# Patient Record
Sex: Female | Born: 1970 | Hispanic: Yes | Marital: Married | Smoking: Never smoker
Health system: Southern US, Community
[De-identification: ages and names within clinical notes are randomized; demographics above are authoritative.]

## PROBLEM LIST (undated history)

## (undated) DIAGNOSIS — K760 Fatty (change of) liver, not elsewhere classified: Secondary | ICD-10-CM

## (undated) DIAGNOSIS — R51 Headache: Secondary | ICD-10-CM

## (undated) DIAGNOSIS — M1990 Unspecified osteoarthritis, unspecified site: Secondary | ICD-10-CM

## (undated) DIAGNOSIS — R109 Unspecified abdominal pain: Secondary | ICD-10-CM

## (undated) DIAGNOSIS — K219 Gastro-esophageal reflux disease without esophagitis: Secondary | ICD-10-CM

## (undated) HISTORY — DX: Fatty (change of) liver, not elsewhere classified: K76.0

## (undated) HISTORY — PX: OTHER SURGICAL HISTORY: SHX169

## (undated) HISTORY — PX: TUBAL LIGATION: SHX77

## (undated) HISTORY — PX: BLADDER SURGERY: SHX569

## (undated) HISTORY — DX: Headache: R51

## (undated) HISTORY — DX: Unspecified osteoarthritis, unspecified site: M19.90

## (undated) HISTORY — DX: Unspecified abdominal pain: R10.9

## (undated) HISTORY — DX: Gastro-esophageal reflux disease without esophagitis: K21.9

---

## 2004-08-23 ENCOUNTER — Emergency Department (HOSPITAL_COMMUNITY): Admission: EM | Admit: 2004-08-23 | Discharge: 2004-08-24 | Payer: Self-pay | Admitting: Emergency Medicine

## 2005-02-14 ENCOUNTER — Emergency Department (HOSPITAL_COMMUNITY): Admission: EM | Admit: 2005-02-14 | Discharge: 2005-02-14 | Payer: Self-pay | Admitting: Emergency Medicine

## 2005-12-10 ENCOUNTER — Encounter: Admission: RE | Admit: 2005-12-10 | Discharge: 2005-12-10 | Payer: Self-pay | Admitting: Internal Medicine

## 2006-08-04 HISTORY — PX: OTHER SURGICAL HISTORY: SHX169

## 2006-08-04 HISTORY — PX: RECTAL PROLAPSE REPAIR: SHX759

## 2006-12-24 ENCOUNTER — Ambulatory Visit: Payer: Self-pay | Admitting: Unknown Physician Specialty

## 2006-12-31 ENCOUNTER — Inpatient Hospital Stay: Payer: Self-pay | Admitting: Unknown Physician Specialty

## 2008-09-05 ENCOUNTER — Encounter: Admission: RE | Admit: 2008-09-05 | Discharge: 2008-09-05 | Payer: Self-pay | Admitting: Neurology

## 2010-08-04 DIAGNOSIS — K760 Fatty (change of) liver, not elsewhere classified: Secondary | ICD-10-CM

## 2010-08-04 HISTORY — DX: Fatty (change of) liver, not elsewhere classified: K76.0

## 2010-10-16 ENCOUNTER — Other Ambulatory Visit: Payer: Self-pay | Admitting: Family Medicine

## 2010-10-16 ENCOUNTER — Ambulatory Visit
Admission: RE | Admit: 2010-10-16 | Discharge: 2010-10-16 | Disposition: A | Discharge status: Home / Self Care | Payer: Self-pay | Source: Ambulatory Visit | Attending: Family Medicine | Admitting: Family Medicine

## 2010-11-28 ENCOUNTER — Ambulatory Visit (INDEPENDENT_AMBULATORY_CARE_PROVIDER_SITE_OTHER): Payer: Commercial Managed Care - PPO | Admitting: Gastroenterology

## 2010-11-28 ENCOUNTER — Encounter: Payer: Self-pay | Admitting: Gastroenterology

## 2010-11-28 VITALS — BP 102/64 | HR 68 | Ht 62.0 in | Wt 205.0 lb

## 2010-11-28 DIAGNOSIS — K760 Fatty (change of) liver, not elsewhere classified: Secondary | ICD-10-CM

## 2010-11-28 DIAGNOSIS — R1011 Right upper quadrant pain: Secondary | ICD-10-CM

## 2010-11-28 DIAGNOSIS — K7689 Other specified diseases of liver: Secondary | ICD-10-CM

## 2010-11-28 NOTE — Patient Instructions (Signed)
Cc: Abbe Amsterdam, MD

## 2010-11-28 NOTE — Progress Notes (Signed)
History of Present Illness: This is a 40 year old female here today with her husband. She notes intermittent right upper quadrant abdominal pain associated with occasional bloating for several months. Her symptoms generally follow meals. She went on a strict weight loss diet about 2 years ago and after returning to her normal diet she noted the onset of the above symptoms. After resuming her weight loss diet. which restricted high fat foods, caffeine and coffee her symptoms have resolved. Recent evaluation at [facility] included normal blood work and abdominal ultrasound was unremarkable only for mild fatty infiltration of the liver. The patient is currently asymptomatic except for rare episodes of heartburn related to spicy foods and the symptoms respond promptly the tongue. She denies abdominal pain chest pain nausea, vomiting, change in bowel habits, constipation, diarrhea, melena, hematochezia.  Review of Systems: Pertinent positive and negative review of systems were noted in the above HPI section. All other review of systems were otherwise negative.  Current Medications, Allergies, Past Medical History, Past Surgical History, Family History and Social History were reviewed in [employer] record.  Physical Exam: General: Well developed , well nourished, no acute distress Head: Normocephalic and atraumatic Eyes:  sclerae anicteric, EOMI Ears: Normal auditory acuity Mouth: No deformity or lesions Neck: Supple, no masses or thyromegaly Lungs: Clear throughout to auscultation Heart: Regular rate and rhythm; no murmurs, rubs or bruits Abdomen: Soft, non tender and non distended. No masses, hepatosplenomegaly or hernias noted. Normal bowel sounds Musculoskeletal: Symmetrical with no gross deformities  Skin: No lesions on visible extremities Pulses:  Normal pulses noted Extremities: No clubbing, cyanosis, edema or deformities noted Neurological: Alert  oriented x 4, grossly nonfocal Cervical Nodes:  No significant cervical adenopathy Inguinal Nodes: No significant inguinal adenopathy Psychological:  Alert and cooperative. Normal mood and affect  Assessment and Recommendations:  1. Right upper quadrant pain that has resolved with dietary changes. Likely dietary intolerances include high fat foods, caffeine and coffee. If her symptoms return despite these dietary restrictions we will consider further evaluation with upper endoscopy and a CCK HIDA scan. Ongoing followup with her primary physician I will see her back if her GI symptoms return.  2. Fatty infiltration of the liver. Recommend a long-term low fat, weight loss diet. Further followup with her primary care physician.

## 2010-12-03 ENCOUNTER — Encounter: Payer: Self-pay | Admitting: Gastroenterology

## 2011-06-03 ENCOUNTER — Other Ambulatory Visit: Payer: Self-pay | Admitting: Unknown Physician Specialty

## 2011-06-03 DIAGNOSIS — Z1231 Encounter for screening mammogram for malignant neoplasm of breast: Secondary | ICD-10-CM

## 2011-06-13 ENCOUNTER — Other Ambulatory Visit: Payer: Self-pay | Admitting: Internal Medicine

## 2011-06-13 ENCOUNTER — Ambulatory Visit
Admission: RE | Admit: 2011-06-13 | Discharge: 2011-06-13 | Disposition: A | Discharge status: Home / Self Care | Payer: Commercial Managed Care - PPO | Source: Ambulatory Visit | Attending: Internal Medicine | Admitting: Internal Medicine

## 2011-06-13 MED ORDER — IOHEXOL 300 MG/ML  SOLN
100.0000 mL | Freq: Once | INTRAMUSCULAR | Status: AC | PRN
  Administered 2011-06-13: 100 mL via INTRAVENOUS

## 2011-07-14 ENCOUNTER — Ambulatory Visit
Admission: RE | Admit: 2011-07-14 | Discharge: 2011-07-14 | Disposition: A | Discharge status: Home / Self Care | Payer: Commercial Managed Care - PPO | Source: Ambulatory Visit | Attending: Unknown Physician Specialty | Admitting: Unknown Physician Specialty

## 2011-07-14 ENCOUNTER — Other Ambulatory Visit: Payer: Self-pay | Admitting: Family Medicine

## 2011-07-14 DIAGNOSIS — Z1231 Encounter for screening mammogram for malignant neoplasm of breast: Secondary | ICD-10-CM

## 2011-07-15 ENCOUNTER — Other Ambulatory Visit: Payer: Self-pay | Admitting: Unknown Physician Specialty

## 2011-07-15 DIAGNOSIS — R928 Other abnormal and inconclusive findings on diagnostic imaging of breast: Secondary | ICD-10-CM

## 2011-07-30 ENCOUNTER — Other Ambulatory Visit: Payer: Commercial Managed Care - PPO

## 2011-08-11 ENCOUNTER — Ambulatory Visit
Admission: RE | Admit: 2011-08-11 | Discharge: 2011-08-11 | Disposition: A | Discharge status: Home / Self Care | Payer: Commercial Managed Care - PPO | Source: Ambulatory Visit | Attending: Unknown Physician Specialty | Admitting: Unknown Physician Specialty

## 2011-08-11 ENCOUNTER — Other Ambulatory Visit: Payer: Self-pay | Admitting: Unknown Physician Specialty

## 2011-08-11 DIAGNOSIS — R928 Other abnormal and inconclusive findings on diagnostic imaging of breast: Secondary | ICD-10-CM

## 2011-08-21 ENCOUNTER — Other Ambulatory Visit: Payer: Self-pay | Admitting: Unknown Physician Specialty

## 2011-08-21 ENCOUNTER — Ambulatory Visit
Admission: RE | Admit: 2011-08-21 | Discharge: 2011-08-21 | Disposition: A | Discharge status: Home / Self Care | Payer: Commercial Managed Care - PPO | Source: Ambulatory Visit | Attending: Unknown Physician Specialty | Admitting: Unknown Physician Specialty

## 2011-08-21 DIAGNOSIS — R928 Other abnormal and inconclusive findings on diagnostic imaging of breast: Secondary | ICD-10-CM

## 2012-03-29 ENCOUNTER — Ambulatory Visit (INDEPENDENT_AMBULATORY_CARE_PROVIDER_SITE_OTHER): Payer: Commercial Managed Care - PPO | Admitting: Physician Assistant

## 2012-03-29 ENCOUNTER — Encounter: Payer: Self-pay | Admitting: Physician Assistant

## 2012-03-29 VITALS — BP 124/76 | HR 86 | Temp 98.2°F | Resp 17 | Ht 62.5 in | Wt 191.0 lb

## 2012-03-29 DIAGNOSIS — R1011 Right upper quadrant pain: Secondary | ICD-10-CM

## 2012-03-29 LAB — POCT CBC
HCT, POC: 41.1 % (ref 37.7–47.9)
MCH, POC: 28.9 pg (ref 27–31.2)
POC Granulocyte: 4.6 (ref 2–6.9)
RBC: 4.4 M/uL (ref 4.04–5.48)

## 2012-03-29 LAB — COMPREHENSIVE METABOLIC PANEL
ALT: 16 U/L (ref 0–35)
Alkaline Phosphatase: 33 U/L — ABNORMAL LOW (ref 39–117)
CO2: 25 mEq/L (ref 19–32)
Chloride: 105 mEq/L (ref 96–112)
Creat: 0.59 mg/dL (ref 0.50–1.10)
Glucose, Bld: 88 mg/dL (ref 70–99)
Sodium: 138 mEq/L (ref 135–145)
Total Bilirubin: 0.4 mg/dL (ref 0.3–1.2)

## 2012-03-29 MED ORDER — RANITIDINE HCL 150 MG PO TABS: 150.0000 mg | ORAL_TABLET | Freq: Two times a day (BID) | ORAL | Status: DC

## 2012-03-29 NOTE — Progress Notes (Signed)
Subjective:    Patient ID: Lauren Andersen, female    DOB: 1971-01-24, 41 y.o.   MRN: [id number]  HPI This 41 y.o. female presents for evaluation of right upper abdominal pain x 3 weeks.  The pain is pressure.  The pain is worst at night, mild during the day. Unable to sleep on that side due to pain.  No apparent change with eating.  No diarrhea or constipation.  No urinary symptoms.  Eating oranges and lemons causes nausea and vomiting, which is new since her pain started.  No fever, chills.  This pain initially began in 2009.  She's had several RUQ ultrasounds and a CT scan.  They revealed a left ovarian cyst and it's resolution, and mild fatty liver infiltration.  She saw Dr. Russella Dar in 11/2010, but doesn't remember it.  His not indicates that her symptoms improved with dietary changes and that if she had a recurrence, he'd plan additional evaluation with an upper endoscopy and CCK HIDA scan.  She was seen here in 06/2011 with the same symptoms, and had trace asymptomatic bacturia.  No culture was sent. She was advised to take Miralax for 3 days.  The patient states her symptoms resolved, but she doesn't recall what helped.  Review of Systems  As above.  No chest pain, SOB, HA, dizziness, vision change, diarrhea, constipation, dysuria, urinary urgency or frequency, myalgias, arthralgias or rash.   Past Medical History  Diagnosis Date  . Hepatic steatosis   . Headache     Past Surgical History  Procedure Date  . Cesarean section     x2  . Bladder surgery   . Tubal ligation     Prior to Admission medications   Medication Sig Start Date End Date Taking? Authorizing Provider  calcium-vitamin D (OSCAL WITH D) 250-125 MG-UNIT per tablet Take 1 tablet by mouth daily.     Yes Historical Provider, MD  fish oil-omega-3 fatty acids 1000 MG capsule Take 1 g by mouth daily.   Yes Historical Provider, MD  Multiple Vitamin (MULTIVITAMIN) tablet Take 1 tablet by mouth daily.     Yes Historical  Provider, MD    No Known Allergies  History   Social History  . Marital Status: Married    Spouse Name: Alecia Lemming    Number of Children: 3  . 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 Smoking status: Never Smoker   . Smokeless tobacco: Never Used  . Alcohol Use: No  . Drug Use: No  . Sexually Active: Yes -- Female partner(s)    Birth Control/ Protection: Surgical   Other Topics Concern  . Not on file   Social History Narrative   Came to the [place] from [place] in 2003.Husband has a son from another partner, plus the three with the patient.    Family History  Problem Relation Age of Onset  . Breast cancer Cousin   . Diabetes Maternal Grandfather   . Thyroid disease Mother   . Mental illness Mother     bipolar disorder, depression       Objective:   Physical Exam  Blood pressure 124/76, pulse 86, temperature 98.2 F (36.8 C), temperature source Oral, resp. rate 17, height 5' 2.5" (1.588 m), weight 191 lb (86.637 kg), last menstrual period 03/15/2012, SpO2 96.00%. Body mass index is 34.38 kg/(m^2). Well-developed, well nourished [place] woman who is awake, alert and oriented, in NAD. HEENT: [place]/AT,  sclera and conjunctiva are clear.   Neck: supple, non-tender, no lymphadenopathy, thyromegaly. Heart: RRR, no murmur Lungs: normal effort, CTA Abdomen: normo-active bowel sounds, supple, no mass or organomegaly.  RUQ tenderness.  Very mild RLQ tenderness.   Extremities: no cyanosis, clubbing or edema. Skin: warm and dry without rash.  Results for orders placed in visit on 03/29/12  POCT CBC      Component Value Range   WBC 7.8  4.6 - 10.2 K/uL   Lymph, poc 2.6  0.6 - 3.4   POC LYMPH PERCENT 33.7  10 - 50 %L   MID (cbc) 0.6  0 - 0.9   POC MID % 7.4  0 - 12 %M   POC Granulocyte 4.6  2 - 6.9   Granulocyte percent 58.9  37 - 80 %G   RBC 4.40  4.04 - 5.48 M/uL   Hemoglobin 12.7  12.2 - 16.2 g/dL   HCT, POC 16.1   09.6 - 47.9 %   MCV 93.5  80 - 97 fL   MCH, POC 28.9  27 - 31.2 pg   MCHC 30.9 (*) 31.8 - 35.4 g/dL   RDW, POC 04.5     Platelet Count, POC 271  142 - 424 K/uL   MPV 8.0  0 - 99.8 fL   She was unable to provide a urine specimen.    Assessment & Plan:   1. Abdominal pain, acute, right upper quadrant  Comprehensive metabolic panel, POCT CBC, Ambulatory referral to Gastroenterology, ranitidine (ZANTAC) 150 MG tablet   Patient Instructions  If you have not heard anything regarding the referral in 10 days, please contact our office. Avoid caffeine, fatty foods and acidic and fatty foods that may increase your symptoms.

## 2012-03-29 NOTE — Patient Instructions (Signed)
If you have not heard anything regarding the referral in 10 days, please contact our office. Avoid caffeine, fatty foods and acidic and fatty foods that may increase your symptoms.

## 2012-03-30 ENCOUNTER — Encounter: Payer: Self-pay | Admitting: Physician Assistant

## 2012-04-16 ENCOUNTER — Ambulatory Visit: Payer: Commercial Managed Care - PPO | Admitting: Gastroenterology

## 2014-06-05 ENCOUNTER — Encounter: Payer: Self-pay | Admitting: Physician Assistant

## 2014-08-22 ENCOUNTER — Ambulatory Visit: Payer: Commercial Managed Care - PPO | Attending: Family Medicine | Admitting: Family Medicine

## 2014-08-22 ENCOUNTER — Encounter: Payer: Self-pay | Admitting: Family Medicine

## 2014-08-22 VITALS — BP 105/66 | HR 60 | Temp 98.5°F | Resp 16 | Ht 62.0 in | Wt 196.0 lb

## 2014-08-22 DIAGNOSIS — H9201 Otalgia, right ear: Secondary | ICD-10-CM

## 2014-08-22 DIAGNOSIS — N644 Mastodynia: Principal | ICD-10-CM

## 2014-08-22 DIAGNOSIS — K760 Fatty (change of) liver, not elsewhere classified: Secondary | ICD-10-CM

## 2014-08-22 DIAGNOSIS — T8589XA Other specified complication of internal prosthetic devices, implants and grafts, not elsewhere classified, initial encounter: Secondary | ICD-10-CM

## 2014-08-22 DIAGNOSIS — T83711S Erosion of implanted vaginal mesh and other prosthetic materials to surrounding organ or tissue, sequela: Secondary | ICD-10-CM

## 2014-08-22 DIAGNOSIS — Z124 Encounter for screening for malignant neoplasm of cervix: Secondary | ICD-10-CM

## 2014-08-22 DIAGNOSIS — T83711A Erosion of implanted vaginal mesh and other prosthetic materials to surrounding organ or tissue, initial encounter: Secondary | ICD-10-CM

## 2014-08-22 DIAGNOSIS — T83718A Erosion of other implanted mesh and other prosthetic materials to surrounding organ or tissue, initial encounter: Secondary | ICD-10-CM

## 2014-08-22 LAB — CBC
HEMATOCRIT: 38.5 % (ref 36.0–46.0)
HEMOGLOBIN: 12.7 g/dL (ref 12.0–15.0)
MCH: 30.2 pg (ref 26.0–34.0)
MCHC: 33 g/dL (ref 30.0–36.0)
MCV: 91.4 fL (ref 78.0–100.0)
MPV: 9.2 fL (ref 8.6–12.4)
PLATELETS: 257 10*3/uL (ref 150–400)
RBC: 4.21 MIL/uL (ref 3.87–5.11)
RDW: 13.4 % (ref 11.5–15.5)
WBC: 7 10*3/uL (ref 4.0–10.5)

## 2014-08-22 LAB — COMPLETE METABOLIC PANEL WITH GFR
ALBUMIN: 4.1 g/dL (ref 3.5–5.2)
ALT: 23 U/L (ref 0–35)
AST: 20 U/L (ref 0–37)
Alkaline Phosphatase: 32 U/L — ABNORMAL LOW (ref 39–117)
BILIRUBIN TOTAL: 0.4 mg/dL (ref 0.2–1.2)
BUN: 20 mg/dL (ref 6–23)
CHLORIDE: 104 meq/L (ref 96–112)
CO2: 24 meq/L (ref 19–32)
CREATININE: 0.69 mg/dL (ref 0.50–1.10)
Calcium: 9.3 mg/dL (ref 8.4–10.5)
GLUCOSE: 82 mg/dL (ref 70–99)
POTASSIUM: 5 meq/L (ref 3.5–5.3)
Sodium: 136 mEq/L (ref 135–145)
Total Protein: 7.4 g/dL (ref 6.0–8.3)

## 2014-08-22 LAB — LIPID PANEL
CHOLESTEROL: 182 mg/dL (ref 0–200)
HDL: 71 mg/dL (ref >39)
LDL CALC: 99 mg/dL (ref 0–99)
Total CHOL/HDL Ratio: 2.6 Ratio
Triglycerides: 59 mg/dL (ref <150)
VLDL: 12 mg/dL (ref 0–40)

## 2014-08-22 NOTE — Assessment & Plan Note (Signed)
1. Painful breast: no dominant mass.  Please call Rolena Infante, [phone],  with the BCCCP (breast and cervical cancer control program) at the [facility] to set up an appointment to verify eligibility for a breast exam, mammogram, ultrasound. If you qualify this will be set up for you.  For pain: I recommend ibuprofen 600 mg up to three times daily with food or tylenol [phone] mg up to three time daily

## 2014-08-22 NOTE — Patient Instructions (Addendum)
Mrs. Lauren Andersen,  Thank you for coming in today. It was a pleasure meeting you. I look forward to being your primary doctor.   1. Painful breast: no dominant mass.  Please call Rolena Infante, [phone],  with the BCCCP (breast and cervical cancer control program) at the [facility] to set up an appointment to verify eligibility for a breast exam, mammogram, ultrasound. If you qualify this will be set up for you.  For pain: I recommend ibuprofen 600 mg up to three times daily with food or tylenol [phone] mg up to three time daily   2. Rectal mesh: On exam I see mesh in your vagina coming up posteriorly ( I believe this is the rectal mesh) Please apply for orange card and [employer] discount. I have placed a referral to gynecology.  You will be called with pap and lab results   F/u in 6 weeks for breast discomfort   Dr. Adrian Blackwater

## 2014-08-22 NOTE — Progress Notes (Signed)
   Subjective:    Patient ID: Lauren Andersen, female    DOB: 1970-11-22, 44 y.o.   MRN: [id number] CC: establish care, R breast pain, Rt ear pain  HPI   1. R breast pain: x 6 months. For the past 2-3 months feeling increased pain and pressure for the past 2-3 months. No skin changes. Patient also feels x mass which has been there for years but is now tender.  She denies symptoms in L breast. She is not taking any OTC for pain.   2. R ear: pain and itching x 4 months. Also when pain on R neck. Sometimes pains is associated with dizziness. No fever, chills, unintentional weight loss.   3. 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 The surgery was done in [place], [place]. Her gynecologist has retired. She does not have op notes. She feels like her bladder mesh and coming out. She reports feeling it when she wipes after urination. She went to another gynecologist in town a few years ago who recommended revision, she wanted to get a 2nd opinion, during that time she loss her insurance.   Soc Hx: chronic non smoker  Med Hx: fatty liver  Surg Hx: c-section x 2  Review of Systems As per HPI  GAD-7: score of 0    Objective:   Physical Exam BP 105/66 mmHg  Pulse 60  Temp(Src) 98.5 F (36.9 C)  Resp 16  Ht 5\' 2"  (1.575 m)  Wt 196 lb (88.905 kg)  BMI 35.84 kg/m2  SpO2 97%  LMP 08/07/2014 General appearance: alert, cooperative and no distress Head: Normocephalic, without obvious abnormality, atraumatic Eyes: conjunctivae/corneas clear. PERRL, EOM's intact. Fundi benign. Ears: normal TM and external ear canal left ear and abnormal TM right ear - air-fluid level Nose: no discharge, turbinates pink, swollen Throat: lips, mucosa, and tongue normal; teeth and gums normal Neck: no adenopathy and thyroid not enlarged, symmetric, no tenderness/mass/nodules Chest: normal WOB Breasts: Inspection negative, No nipple retraction or  dimpling, No nipple discharge or bleeding, No axillary or supraclavicular adenopathy, positive findings: tenderness throughout both breast with no dominant mass  Pelvic: normal external genitalia, mesh material protruding from posterior vaginal wall, otherwise normal vagina, normal cervix, scant mucoid discharge. No CMT, uterine or adnexal tenderness, normal rectal exam w/o pain, mass or tenderness.  Pap done today.     Assessment & Plan:

## 2014-08-22 NOTE — Progress Notes (Signed)
Patient here to establish care C/o right ear pain/dizziness for 4 months Rt. Breast pain-last mammogram in 2013 Pap in 2013 Patient is not fasting Refused flu shot

## 2014-08-22 NOTE — Assessment & Plan Note (Signed)
F/u CMP and lipids today

## 2014-08-22 NOTE — Assessment & Plan Note (Signed)
Pap done today  

## 2014-08-22 NOTE — Assessment & Plan Note (Addendum)
A:  Rectal mesh erosion.  P: On exam I see mesh in your vagina coming up posteriorly ( I believe this is the rectal mesh) Please apply for orange card and [place] discount. I have placed a referral to gynecology.  Addendum: referral to urology at [facility]

## 2014-08-23 LAB — CYTOLOGY - PAP

## 2014-08-23 LAB — CERVICOVAGINAL ANCILLARY ONLY
CHLAMYDIA, DNA PROBE: NEGATIVE
NEISSERIA GONORRHEA: NEGATIVE
WET PREP (BD AFFIRM): NEGATIVE
WET PREP (BD AFFIRM): NEGATIVE
Wet Prep (BD Affirm): NEGATIVE

## 2014-08-27 NOTE — Addendum Note (Signed)
Addended by: Boykin Nearing on: 08/27/2014 07:01 PM   Modules accepted: Orders

## 2014-08-28 ENCOUNTER — Telehealth: Payer: Self-pay | Admitting: *Deleted

## 2014-08-28 NOTE — Telephone Encounter (Signed)
Pt aware of lab wet prep and pap smear results     Notes Recorded by Minerva Ends, MD on 08/24/2014 at 5:57 PM Negative pap, repeat in 3 years

## 2014-08-28 NOTE — Telephone Encounter (Signed)
-----   Message from Minerva Ends, MD sent at 08/24/2014  1:11 PM EST ----- All labs normal Negative wet prep. Still awaiting pap results

## 2014-09-08 ENCOUNTER — Ambulatory Visit: Payer: Commercial Managed Care - PPO

## 2014-09-27 ENCOUNTER — Other Ambulatory Visit (HOSPITAL_COMMUNITY): Payer: Self-pay | Admitting: *Deleted

## 2014-09-27 ENCOUNTER — Encounter (HOSPITAL_COMMUNITY): Payer: Self-pay | Admitting: *Deleted

## 2014-09-27 DIAGNOSIS — N644 Mastodynia: Principal | ICD-10-CM

## 2014-09-28 ENCOUNTER — Encounter (HOSPITAL_COMMUNITY): Payer: Self-pay

## 2014-09-28 ENCOUNTER — Ambulatory Visit (HOSPITAL_COMMUNITY)
Admission: RE | Admit: 2014-09-28 | Discharge: 2014-09-28 | Disposition: A | Discharge status: Home / Self Care | Payer: Commercial Managed Care - PPO | Source: Ambulatory Visit | Attending: Obstetrics and Gynecology | Admitting: Obstetrics and Gynecology

## 2014-09-28 VITALS — BP 118/70 | Temp 97.9°F | Ht 62.0 in | Wt 195.2 lb

## 2014-09-28 DIAGNOSIS — Z1239 Encounter for other screening for malignant neoplasm of breast: Principal | ICD-10-CM

## 2014-09-28 DIAGNOSIS — N644 Mastodynia: Secondary | ICD-10-CM

## 2014-09-28 DIAGNOSIS — N6310 Unspecified lump in the right breast, unspecified quadrant: Secondary | ICD-10-CM

## 2014-09-28 DIAGNOSIS — N6315 Unspecified lump in the right breast, overlapping quadrants: Secondary | ICD-10-CM

## 2014-09-28 NOTE — Addendum Note (Signed)
Encounter addended by: Loletta Parish, RN on: 09/28/2014 10:58 AM<BR>     Documentation filed: Patient Instructions Section

## 2014-09-28 NOTE — Patient Instructions (Addendum)
Education materials on self breast awareness given. Explained to Lauren Andersen that [place] will cover Pap smears and co-testing every 5 years unless has a history of abnormal Pap smears. Let patient know that her next Pap smear will be due in January 2021. Referred patient to the [place] for bilateral diagnostic mammogram possible right breast ultrasoud. Appointment scheduled for Thursday, October 05, 2014 at 0900. Patient aware of appointment and will be there. [name] verbalized understanding.

## 2014-09-28 NOTE — Progress Notes (Addendum)
Complaints of right breast pain x 6 months that feels like something poking. Patient states pain comes and goes lasting for around 3-5 minutes. Patient rates pain at a 9-10 out of 10. Patient complained of a right breast lump that has been there since she was 44 years old.  Pap Smear:  Pap smear not completed today. Last Pap smear was 08/22/2014 at [facility] and normal with negative HPV. Per patient has no history of an abnormal Pap smear. Last Pap smear result is in EPIC.  Physical exam: Breasts Left breast is slightly larger than right breast.  No skin abnormalities bilateral breasts. No nipple retraction bilateral breasts. No nipple discharge bilateral breasts. No lymphadenopathy. No lumps palpated left breast. Palpated a lump within the right breast at 6 o'clock 3 cm from the nipple. Complaints of left outer and lower breast tenderness on exam. Patient complained of right lower breast and pain when palpated lump. Referred patient to the [facility] for bilateral diagnostic mammogram possible right breast ultrasoud. Appointment scheduled for Thursday, October 05, 2014 at 0900.  Pelvic/Bimanual No Pap smear completed today since last Pap smear was 08/22/2014. Pap smear not indicated per BCCCP guidelines.   Used interpreter Benjamine Sprague.

## 2014-09-28 NOTE — Addendum Note (Signed)
Encounter addended by: Loletta Parish, RN on: 09/28/2014 11:41 AM<BR>     Documentation filed: Notes Section

## 2014-10-05 ENCOUNTER — Ambulatory Visit
Admission: RE | Admit: 2014-10-05 | Discharge: 2014-10-05 | Disposition: A | Discharge status: Home / Self Care | Payer: No Typology Code available for payment source | Source: Ambulatory Visit | Attending: Obstetrics and Gynecology | Admitting: Obstetrics and Gynecology

## 2014-10-05 DIAGNOSIS — N644 Mastodynia: Secondary | ICD-10-CM

## 2015-08-15 ENCOUNTER — Ambulatory Visit (INDEPENDENT_AMBULATORY_CARE_PROVIDER_SITE_OTHER): Payer: Self-pay | Admitting: Urgent Care

## 2015-08-15 DIAGNOSIS — M62838 Other muscle spasm: Secondary | ICD-10-CM

## 2015-08-15 DIAGNOSIS — M542 Cervicalgia: Secondary | ICD-10-CM

## 2015-08-15 DIAGNOSIS — M62830 Muscle spasm of back: Secondary | ICD-10-CM

## 2015-08-15 DIAGNOSIS — M549 Dorsalgia, unspecified: Secondary | ICD-10-CM

## 2015-08-15 DIAGNOSIS — M6248 Contracture of muscle, other site: Secondary | ICD-10-CM

## 2015-08-15 MED ORDER — CYCLOBENZAPRINE HCL 5 MG PO TABS: 5.0000 mg | ORAL_TABLET | Freq: Three times a day (TID) | ORAL | Status: DC | PRN

## 2015-08-15 MED ORDER — MELOXICAM 7.5 MG PO TABS: 7.5000 mg | ORAL_TABLET | Freq: Every day | ORAL | Status: DC
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
## 2018-02-16 ENCOUNTER — Ambulatory Visit: Payer: Self-pay | Attending: Nurse Practitioner | Admitting: Nurse Practitioner

## 2018-02-16 ENCOUNTER — Encounter: Payer: Self-pay | Admitting: Nurse Practitioner

## 2018-02-16 ENCOUNTER — Ambulatory Visit: Payer: Self-pay | Admitting: Nurse Practitioner

## 2018-02-16 VITALS — BP 113/77 | HR 90 | Temp 99.0°F | Ht 62.0 in | Wt 216.0 lb

## 2018-02-16 DIAGNOSIS — M7989 Other specified soft tissue disorders: Secondary | ICD-10-CM

## 2018-02-16 DIAGNOSIS — R399 Unspecified symptoms and signs involving the genitourinary system: Principal | ICD-10-CM

## 2018-02-16 DIAGNOSIS — N946 Dysmenorrhea, unspecified: Secondary | ICD-10-CM

## 2018-02-16 DIAGNOSIS — N921 Excessive and frequent menstruation with irregular cycle: Secondary | ICD-10-CM

## 2018-02-16 DIAGNOSIS — M79672 Pain in left foot: Secondary | ICD-10-CM | POA: Insufficient documentation

## 2018-02-16 LAB — POCT URINALYSIS DIPSTICK
Bilirubin, UA: NEGATIVE
Blood, UA: NEGATIVE
Glucose, UA: NEGATIVE
LEUKOCYTES UA: NEGATIVE
Nitrite, UA: NEGATIVE
PH UA: 5 (ref 5.0–8.0)
Protein, UA: NEGATIVE
Spec Grav, UA: 1.025 (ref 1.010–1.025)
UROBILINOGEN UA: 0.2 U/dL

## 2018-02-16 MED ORDER — IBUPROFEN 800 MG PO TABS: 800.0000 mg | ORAL_TABLET | Freq: Three times a day (TID) | ORAL | 1 refills | Status: DC | PRN

## 2018-02-16 NOTE — Patient Instructions (Signed)
Fascitis plantar  (Plantar Fasciitis)  La fascitis plantar es una afeccin dolorosa que se produce en el taln. Ocurre cuando la banda de tejido que conecta los dedos con el hueso del taln (fascia plantar) se irrita. Esto puede ocurrir despus de hacer mucho ejercicio u otras actividades repetitivas (lesin por uso excesivo). El dolor de la fascitis plantar puede ser de leve (irritacin) a intenso, y en los casos ms agudos puede dificultar que la persona camine o se mueva. Por lo general, el dolor es peor a la maana o despus de permanecer sentado o acostado durante un perodo.  CAUSAS  Este trastorno puede ser causado por:   Estar de pie durante largos perodos.   Usar zapatos que no calcen bien.   Practicar actividades de alto impacto, como correr, o hacer ejercicios aerbicos o ballet.   Tener sobrepeso.   Tener una forma de caminar (marcha) anormal.   Tener los msculos de la pantorrilla tensos.   Tener el arco alto en los pies.   Comenzar una nueva actividad fsica.  SNTOMAS  El sntoma principal de esta afeccin es el dolor en el taln. Otros sntomas pueden ser los siguientes:   Dolor que empeora despus de una actividad o un ejercicio.   Dolor ms intenso a la maana o despus de descansar.   Dolor que desaparece despus de caminar durante unos minutos.  DIAGNSTICO  Esta afeccin se puede diagnosticar en funcin de los signos y los sntomas. El mdico tambin le realizar un examen fsico para controlar si tiene lo siguiente:   Un rea dolorida en la parte inferior del pie.   El arco alto.   Dolor al mover el pie.   Dificultad para mover el pie.  Tambin puede necesitar estudios por imgenes para confirmar el diagnstico. Estos pueden incluir los siguientes:   Radiografas.   Ecografa.   Resonancia magntica.  TRATAMIENTO  El tratamiento de la fascitis plantar depende de la gravedad de la afeccin. El tratamiento puede incluir lo siguiente:   Reposo, hielo y analgsicos de venta  libre para controlar el dolor.   Ejercicios para estirar las pantorrillas y la fascia plantar.   Una frula que mantiene el pie estirado y hacia arriba mientras usted duerme (frula nocturna).   Fisioterapia para aliviar los sntomas y evitar problemas en el futuro.   Inyecciones de cortisona para aliviar el dolor intenso.   Tratamiento con ondas de choque extracorpreas para estimular con impulsos elctricos la fascia plantar lesionada. Esto suele usarse como un ltimo recurso antes de la ciruga.   Ciruga, si los otros tratamientos no han funcionado despus de 12meses.  INSTRUCCIONES PARA EL CUIDADO EN EL HOGAR   Tome los medicamentos solamente como se lo haya indicado el mdico.   Evite las actividades que causan dolor.   Frote la parte inferior del pie sobre una bolsa de hielo o una botella de agua fra. Haga esto durante 20minutos, de 3a 4veces al da.   Realice estiramientos simples como se lo haya indicado el mdico.   Trate de usar calzado deportivo con amortiguacin de aire o gel, o plantillas blandas.   Si el mdico se lo ha indicado, use una frula nocturna para dormir.   Cumpla con todas las visitas de control, segn le indique su mdico.  PREVENCIN   No realice ejercicios ni actividades que le causen dolor en el taln.   Considere la posibilidad de empezar actividades de bajo impacto si sigue teniendo problemas.   Pierda peso si   lo necesita.  La mejor forma de prevenir la fascitis plantar es evitar las actividades que lesionan ms la fascia plantar.  SOLICITE ATENCIN MDICA SI:   Los sntomas no desaparecen despus del tratamiento en su casa.   El dolor empeora.   El dolor afecta la capacidad de moverse o de realizar las actividades diarias.  Esta informacin no tiene como fin reemplazar el consejo del mdico. Asegrese de hacerle al mdico cualquier pregunta que tenga.  Document Released: 04/30/2005 Document Revised: 11/12/2015 Document Reviewed: 05/31/2014  Elsevier  Interactive Patient Education  2018 Elsevier Inc.

## 2018-02-16 NOTE — Progress Notes (Signed)
Assessment & Plan:  Lauren Andersen was seen today for establish care.  Diagnoses and all orders for this visit:  UTI symptoms -     Urinalysis Dipstick  Dysmenorrhea -     Urine cytology ancillary only -     ibuprofen (ADVIL,MOTRIN) 800 MG tablet; Take 1 tablet (800 mg total) by mouth every 8 (eight) hours as needed.  Left foot pain -     DG Foot Complete Left; Future Chronic. Endorses Pain in the heel of the foot and well as ankle swelling. She denies any trauma or injury. Pain prevents her from exercising. I do not feel she needs an xray however she is requesting an xray be ordered and is willing to pay out of pocket despite my recommendations to practice exercises at home for plantar fasciitis.   Patient has been counseled on age-appropriate routine health concerns for screening and prevention. These are reviewed and up-to-date. Referrals have been placed accordingly. Immunizations are up-to-date or declined.    Subjective:   Chief Complaint  Patient presents with  . Establish Care    Pt. is here to establish care. Pt. stated she is having urinary problem with her urine are dripping instead of normal flow and comes out slowly.    HPI Lauren Andersen 47 y.o. female presents to office today to establish care. VRI was used to communicate directly with patient for the entire encounter including providing detailed patient instructions. Her husband is accompanying her today.    UTI Symptoms She has complaints of UTI symptoms. However per review of her records she was referred to GYN after her PCP at that time noticed her rectal mesh inside her vagina. She had bladder and rectal prolapse with mesh placed in 2008. She had some complications thereafter and felt as if her mesh had dislodged. Unfortunately her GYN had retired so she had to see another GYN who recommended revision. She did not follow up due to lack of insurance. She will need to be referred however her financial assistance needs to  be renewed. Symptoms today are endorsed as experiencing a slower urinary stream.  She has had symptoms for several months. Patient denies fever, stomach ache and vaginal discharge. Patient does not have a history of recurrent UTI.  Patient does not have a history of pyelonephritis.  She endorses dysmenorrhea,  menorrhagia with metrorrhagia, menstrual blood is darker in color and there is a foul odor.  Onset of these symptoms: 5 months ago.     Review of Systems  Constitutional: Negative for fever, malaise/fatigue and weight loss.  HENT: Negative.  Negative for nosebleeds.   Eyes: Negative.  Negative for blurred vision, double vision and photophobia.  Respiratory: Negative.  Negative for cough and shortness of breath.   Cardiovascular: Negative.  Negative for chest pain, palpitations and leg swelling.  Gastrointestinal: Negative.  Negative for heartburn, nausea and vomiting.  Genitourinary:       SEE HPI  Musculoskeletal: Positive for joint pain. Negative for myalgias.       SEE HPI  Neurological: Negative.  Negative for dizziness, focal weakness, seizures and headaches.  Psychiatric/Behavioral: Negative.  Negative for suicidal ideas.    Past Medical History:  Diagnosis Date  . Fatty liver 2012  . Headache(784.0)   . Hepatic steatosis     Past Surgical History:  Procedure Laterality Date  . bladder mesh  2008  . BLADDER SURGERY    . CESAREAN SECTION     x2  . colon mesh    .  RECTAL PROLAPSE REPAIR  2008  . TUBAL LIGATION      Family History  Problem Relation Age of Onset  . Thyroid disease Mother   . Mental illness Mother        bipolar disorder, depression  . Breast cancer Cousin   . Cancer Maternal Aunt   . Cancer Maternal Aunt     Social History Reviewed with no changes to be made today.   Outpatient Medications Prior to Visit  Medication Sig Dispense Refill  . calcium-vitamin D (OSCAL WITH D) 250-125 MG-UNIT per tablet Take 1 tablet by mouth daily.      . fish  oil-omega-3 fatty acids 1000 MG capsule Take 1 g by mouth daily. Reported on 08/15/2015    . Multiple Vitamin (MULTIVITAMIN) tablet Take 1 tablet by mouth daily.      . ranitidine (ZANTAC) 150 MG tablet Take 1 tablet (150 mg total) by mouth 2 (two) times daily. 60 tablet 0  . cyclobenzaprine (FLEXERIL) 5 MG tablet Take 1-2 tablets (5-10 mg total) by mouth 3 (three) times daily as needed for muscle spasms. (Patient not taking: Reported on 02/16/2018) 60 tablet 1  . meloxicam (MOBIC) 7.5 MG tablet Take 1-2 tablets (7.5-15 mg total) by mouth daily. (Patient not taking: Reported on 02/16/2018) 30 tablet 0   No facility-administered medications prior to visit.     No Known Allergies     Objective:    BP 113/77 (BP Location: Left Arm, Patient Position: Sitting, Cuff Size: Large)   Pulse 90   Temp 99 F (37.2 C) (Oral)   Ht 5\' 2"  (1.575 m)   Wt 216 lb (98 kg)   SpO2 95%   BMI 39.51 kg/m  Wt Readings from Last 3 Encounters:  02/16/18 216 lb (98 kg)  08/15/15 203 lb 12.8 oz (92.4 kg)  09/28/14 195 lb 3.2 oz (88.5 kg)    Physical Exam  Constitutional: She is oriented to person, place, and time. She appears well-developed and well-nourished. She is cooperative.  HENT:  Head: Normocephalic and atraumatic.  Eyes: EOM are normal.  Neck: Normal range of motion.  Cardiovascular: Normal rate, regular rhythm, normal heart sounds and intact distal pulses. Exam reveals no gallop and no friction rub.  No murmur heard. Pulmonary/Chest: Effort normal and breath sounds normal. No tachypnea. No respiratory distress. She has no decreased breath sounds. She has no wheezes. She has no rhonchi. She has no rales. She exhibits no tenderness.  Abdominal: Bowel sounds are normal.  Musculoskeletal: Normal range of motion. She exhibits no edema or deformity.       Left foot: There is normal range of motion and no bony tenderness.       Feet:  Neurological: She is alert and oriented to person, place, and time.  Coordination normal.  Skin: Skin is warm and dry.  Psychiatric: She has a normal mood and affect. Her behavior is normal. Judgment and thought content normal.  Nursing note and vitals reviewed.      Patient has been counseled extensively about nutrition and exercise as well as the importance of adherence with medications and regular follow-up. The patient was given clear instructions to go to ER or return to medical center if symptoms don't improve, worsen or new problems develop. The patient verbalized understanding.   Follow-up: Return for PAP SMEAR, Fasting labs.   Gildardo Pounds, FNP-BC [facility] [place], [place]   02/19/2018, 12:03 AM

## 2018-02-17 ENCOUNTER — Ambulatory Visit (HOSPITAL_COMMUNITY)
Admission: RE | Admit: 2018-02-17 | Discharge: 2018-02-17 | Disposition: A | Discharge status: Home / Self Care | Payer: Self-pay | Source: Ambulatory Visit | Attending: Nurse Practitioner | Admitting: Nurse Practitioner

## 2018-02-17 DIAGNOSIS — M79675 Pain in left toe(s): Secondary | ICD-10-CM

## 2018-02-17 DIAGNOSIS — M79672 Pain in left foot: Principal | ICD-10-CM

## 2018-02-19 ENCOUNTER — Encounter: Payer: Self-pay | Admitting: Nurse Practitioner

## 2018-02-19 LAB — URINE CYTOLOGY ANCILLARY ONLY
Bacterial vaginitis: NEGATIVE
Candida vaginitis: NEGATIVE
Chlamydia: NEGATIVE
NEISSERIA GONORRHEA: NEGATIVE
Trichomonas: NEGATIVE

## 2018-02-20 NOTE — Progress Notes (Signed)
This encounter was created in error - please disregard.

## 2018-02-22 ENCOUNTER — Telehealth: Payer: Self-pay

## 2018-02-22 NOTE — Telephone Encounter (Signed)
CMA spoke to patient to inform on lab results.  Patient understood.

## 2018-02-22 NOTE — Telephone Encounter (Signed)
-----   Message from Gildardo Pounds, NP sent at 02/19/2018 12:12 AM EDT ----- Lauren Andersen shows bone spurs in your heel. Bone spurs can sometimes be treated without the assistance of a foot specialist. Some things you can try at home:  Medication Analgesics: To manage pain. ibuprofen Use cold compress Use orthotic shoe inserts Weight loss to manage obesity Resting stressed muscles by strapping or wrapping your foot with athletic tape  Stretching exercises If these do not work you can always call a podiatrist or foot specialist.

## 2018-02-22 NOTE — Telephone Encounter (Signed)
-----   Message from Gildardo Pounds, NP sent at 02/21/2018  8:44 PM EDT ----- Urine negative for any bacterial infection or yeast

## 2018-02-22 NOTE — Telephone Encounter (Signed)
CMA spoke to patient to inform on Xray results and advising.   Patient verified DOB. Patient understood.  Patient stated she will come by the office to get her Xray results.

## 2018-03-24 ENCOUNTER — Ambulatory Visit: Payer: Self-pay | Attending: Nurse Practitioner | Admitting: Nurse Practitioner

## 2018-03-24 ENCOUNTER — Encounter: Payer: Self-pay | Admitting: Nurse Practitioner

## 2018-03-24 VITALS — BP 101/69 | HR 63 | Temp 98.4°F | Ht 62.0 in | Wt 218.2 lb

## 2018-03-24 DIAGNOSIS — Z01419 Encounter for gynecological examination (general) (routine) without abnormal findings: Principal | ICD-10-CM

## 2018-03-24 DIAGNOSIS — Z79899 Other long term (current) drug therapy: Secondary | ICD-10-CM

## 2018-03-24 DIAGNOSIS — Z791 Long term (current) use of non-steroidal anti-inflammatories (NSAID): Secondary | ICD-10-CM

## 2018-03-24 DIAGNOSIS — Z124 Encounter for screening for malignant neoplasm of cervix: Secondary | ICD-10-CM

## 2018-03-24 NOTE — Patient Instructions (Signed)
Prueba de Papanicolaou (Pap Test) POR QU ME DEBO REALIZAR ESTA PRUEBA? A esta prueba tambin se la denomina "frotis de Pap". Es una prueba de deteccin que se [place] para Hydrographic surveyor signos de cncer de vagina, cuello del tero y tero. La prueba tambin puede identificar la presencia de infeccin o cambios precancerosos. El mdico probablemente le recomiende que se realice esta prueba en forma regular. Esta prueba puede realizarse de la siguiente manera:  Cada 3 aos, a partir de los 21 aos.  Cada 5 aos, en combinacin con las pruebas que se realizan para Product manager presencia del virus del Engineer, technical sales (VPH).  Con mayor o menor frecuencia, en funcin de otras enfermedades que tenga. QU TIPO DE MUESTRA SE TOMA? El mdico utilizar un pequeo hisopo de algodn, una esptula de plstico o un cepillo para Field seismologist una muestra de clulas de la superficie del cuello del tero. El cuello del tero es la apertura del tero, que tambin se conoce como matriz. Tambin pueden recolectarse las secreciones del cuello del tero y la vagina. [place]?  Tenga en cuenta en qu etapa del ciclo menstrual se encuentra. Es posible que Estate agent la prueba si est Forensic psychologist en que debe realizrsela.  Si el da en que debe realizarse la prueba tiene una infeccin vaginal aparente, deber reprogramar la prueba.  Pueden pedirle que evite tomar una ducha o bao el da de la prueba o el da anterior.  Algunos medicamentos pueden [employer] de la prueba, como los digitlicos y Lexicographer. Si toma alguno de [employer], hable con su mdico antes de [employer] prueba. [place]? Los [employer] de la prueba pueden indicar diversas enfermedades. Estas pueden incluir lo siguiente:  Cncer. Si bien los resultados de la prueba de Papanicolaou no pueden utilizarse para Community education officer de cuello del tero,  de vagina o de tero, pueden indicar que existe una posibilidad de presencia de cncer. En [place], ser necesario realizar pruebas adicionales para determinar la presencia de cncer.  Enfermedad de transmisin sexual.  Infecciones por hongos.  Infeccin por parsitos.  Infeccin por herpes.  Una enfermedad que causa o favorece la infertilidad. Es su responsabilidad retirar el resultado del [place]. Consulte en el laboratorio o en el departamento en el que fue realizado el estudio cundo y cmo podr [employer]. Comunquese con el mdico si tiene [employer]. Esta informacin no tiene Marine scientist el consejo del mdico. Asegrese de hacerle al mdico cualquier pregunta que tenga. Document Released: 01/07/2008 Document Revised: 08/11/2014 Document Reviewed: 12/12/2013 Elsevier Interactive Patient Education  2018 [place] cncer de cuello uterino Preventing Cervical Cancer El cncer de cuello uterino es un tipo de cncer que se desarrolla en el cuello uterino. El cuello uterino est en la parte inferior del tero. Conecta el tero con la vagina. El tero es el lugar donde el beb se desarrolla durante el embarazo. El cncer ocurre cuando algunas clulas comienzan a tener anomalas y crecen de forma descontrolada. El cncer de cuello uterino se desarrolla lentamente y podra no causar ningn sntoma al principio. Con el paso del [place], el cncer puede desarrollarse a mayor profundidad en el tejido del cuello uterino y expandirse a otras zonas. Si se halla a tiempo, el cncer de cuello uterino puede tratarse de forma eficaz. Tambin puede tomar medidas para prevenir este tipo de cncer. La mayora de los casos de cncer de  cuello uterino son causados por una ITS (infeccin de transmisin sexual) a raz de un virus llamado virus del Engineer, technical sales (VPH). Una forma de disminuir el riesgo de Horticulturist, commercial de cuello uterino es evitar la  infeccin por el VPH. Gerlene Burdock, debe practicar sexo con proteccin y colocarse la vacuna contra el VPH. Tambin es importante que se realice pruebas de Papanicolaou con regularidad, ya que estas pueden ayudar a identificar cambios en las clulas que podran Dentist. Las posibilidades de Actor esta enfermedad tambin pueden reducirse realizando ciertos cambios en el estilo de vida. Cmo puedo protegerme contra el cncer de cuello uterino? Prevencin contra la infeccin por VPH  Pregntele a su mdico acerca de cmo recibir la Charity fundraiser VPH. Si tiene 26aos o menos, es posible que deba colocarse esta vacuna, que se administra en tres dosis durante 10meses. Esta vacuna la protege contra los tipos de VPH que podran Dentist.  Limite la cantidad de personas con las que [employer]. [place], evite tener sexo con personas que han tenido demasiadas parejas sexuales.  Use preservativos de ltex durante las relaciones sexuales. Realizacin de las pruebas de Papanicolaou  Desde los 21aos, realcese pruebas de Papanicolaou con regularidad. Hable con el mdico sobre la frecuencia con la que debe hacerse estas pruebas. ? [employer] de las mujeres que [place] [id number] deben realizarse una prueba de Papanicolaou cada 3aos. ? Benito Mccreedy de las mujeres que [place] [id number] deben realizarse una prueba de Papanicolaou y Ardelia Mems prueba del VPH cada 5aos. ? Las mujeres que presentan un mayor riesgo de Horticulturist, commercial de cuello uterino, como aquellas con un debilitamiento del sistema inmunitario o aquellas que han estado expuestas al frmaco dietilestilbestrol (DES), posiblemente deban realizarse las pruebas con mayor frecuencia. Realizacin de otros cambios en el estilo de vida  No consuma ningn producto que contenga nicotina o tabaco, como cigarrillos y Psychologist, sport and exercise. Si necesita ayuda para dejar de fumar, consulte al mdico.  Consuma,  como mnimo, 5porciones de frutas y verduras todos [place].  Baje de peso si es necesario. Por qu son importantes estos cambios?  Con estos cambios y pruebas de deteccin, se abordan los factores que, segn los expertos, [place] el riesgo de Insurance risk surveyor cncer de cuello uterino. La mejor forma de disminuir el riesgo es seguir estos pasos.  Realizarse pruebas de Papanicolaou con regularidad ayudar a identificar cambios en las clulas que podran Dentist. Luego, se podrn tomar medidas para evitar el desarrollo del cncer.  Estos cambios tambin ayudarn a Public affairs consultant de cuello uterino a tiempo. Si se halla a tiempo, el cncer de cuello uterino puede tratarse de forma eficaz. Puede ser ms peligroso y difcil de tratar si el cncer se ha desarrollado a mayor profundidad en el cuello uterino o si se ha expandido.  Adems de hacer que sea menos propenso a padecer cncer de cuello uterino, estos cambios [employer] otorgarn otros beneficios a su salud, como los que se describen a continuacin: ? Practicar sexo con proteccin es importante para prevenir las ITS y los embarazos no deseados. ? Evitar el consumo de tabaco puede disminuir el riesgo de padecer otros tipos de cncer y problemas cardacos. ? Seguir una dieta saludable y mantener un peso saludable son buenos para su salud general. Qu puede suceder si no se hacen cambios? En su etapa inicial, el cncer de cuello uterino posiblemente no tenga sntomas. El cncer puede tardar varios aos en desarrollarse y expandirse a mayor  profundidad en el tejido del cuello uterino. Esta situacin podra ocurrir sin que usted lo sepa. Si desarrolla algn sntoma, como dolor en la pelvis o alguna secrecin o sangrado inusuales de la vagina, debe consultar con su mdico de inmediato. Si el cncer de cuello uterino no se halla a tiempo, podra tener que someterse a ciertos tratamientos, como radiacin, quimioterapia o [place]. En algunos casos,  realizarse una ciruga podra implicar que no podr quedar embarazada o completar un embarazo con xito. Dnde encontrar apoyo: Hable con el mdico, el enfermero de la escuela o el departamento de salud de su localidad para obtener orientacin sobre los estudios de deteccin y la vacunacin. Es posible que algunos nios y adolescentes puedan colocarse la vacuna contra el VPH sin cargo por medio del programa Vacunas para Nios (Vaccines for [employer]) del gobierno de [place]. Otros lugares en los que se vacuna:  Clnicas de salud pblica. Pregunte al departamento de salud de su localidad.  Centros de salud federalmente calificados, donde solo debera pagar lo que est a su alcance. Para encontrar alguno cerca de su lugar de residencia, consulte este sitio web: http://lyons.com/.  Clnicas de salud rural. Estas son parte de un programa para pacientes de Medicare y de [place] que viven en zonas rurales.  [place] de Deteccin Temprana del Cncer de [place] y de Cuello Uterino de [place] tambin proporciona exmenes de deteccin y servicios de diagnstico de estos tipos de cncer a mujeres de bajos recursos, sin cobertura mdica o con coberturas inadecuadas. El cncer de cuello uterino puede transmitirse de [place] a hijas. Hable con el mdico o el asesor gentico para obtener ms informacin sobre las pruebas genticas para Science writer. Dnde encontrar ms informacin: Puede obtener ms informacin sobre el cncer de cuello uterino [employer] siguientes sitios:  Colegio Americano de Animal nutritionist y Academic librarian of Gynecology): [email or web address].[email or web address]  Sociedad Museum/gallery conservator ([place]): www.cancer.org/cancer/cervicalcancer/  Centros para el Control y Publishing copy de [place](Centers for Disease Control and Prevention, CDC):www.cdc.gov/cancer/cervical/  Resumen  Converse con su mdico acerca de cmo recibir la  Charity fundraiser VPH.  Asegrese de realizarse pruebas de Papanicolaou con regularidad segn las recomendaciones del mdico.  Si siente algn dolor en la pelvis o tiene alguna secrecin o sangrado inusuales de la vagina, consulte con su mdico de inmediato. Esta informacin no tiene Marine scientist el consejo del mdico. Asegrese de hacerle al mdico cualquier pregunta que tenga. Document Released: 10/29/2016 Document Revised: 10/29/2016 Document Reviewed: 08/05/2015 Elsevier Interactive Patient Education  [employer].

## 2018-03-24 NOTE — Progress Notes (Signed)
Assessment & Plan:  Lauren Andersen was seen today for gynecologic exam.  Diagnoses and all orders for this visit:  Pap smear for cervical cancer screening -     Cytology - PAP  Well woman exam with routine gynecological exam -     Cytology - PAP   Patient has been counseled on age-appropriate routine health concerns for screening and prevention. These are reviewed and up-to-date. Referrals have been placed accordingly. Immunizations are up-to-date or declined.    Subjective:   Chief Complaint  Patient presents with  . Gynecologic Exam   HPI Lauren Andersen 47 y.o. female presents to office today for routine PAP smear. She has no issues or concerns today.   Review of Systems  Constitutional: Negative.  Negative for chills, fever, malaise/fatigue and weight loss.  Respiratory: Negative.  Negative for cough, shortness of breath and wheezing.   Cardiovascular: Negative.  Negative for chest pain, orthopnea and leg swelling.  Gastrointestinal: Negative for abdominal pain.  Genitourinary: Negative.  Negative for flank pain.  Skin: Negative.  Negative for rash.  Psychiatric/Behavioral: Negative for suicidal ideas.    Past Medical History:  Diagnosis Date  . Fatty liver 2012  . Headache(784.0)   . Hepatic steatosis     Past Surgical History:  Procedure Laterality Date  . bladder mesh  2008  . BLADDER SURGERY    . CESAREAN SECTION     x2  . colon mesh    . RECTAL PROLAPSE REPAIR  2008  . TUBAL LIGATION      Family History  Problem Relation Age of Onset  . Thyroid disease Mother   . Mental illness Mother        bipolar disorder, depression  . Breast cancer Cousin   . Cancer Maternal Aunt   . Cancer Maternal Aunt     Social History Reviewed with no changes to be made today.   Outpatient Medications Prior to Visit  Medication Sig Dispense Refill  . calcium-vitamin D (OSCAL WITH D) 250-125 MG-UNIT per tablet Take 1 tablet by mouth daily.      . fish oil-omega-3 fatty  acids 1000 MG capsule Take 1 g by mouth daily. Reported on 08/15/2015    . Multiple Vitamin (MULTIVITAMIN) tablet Take 1 tablet by mouth daily.      Marland Kitchen ibuprofen (ADVIL,MOTRIN) 800 MG tablet Take 1 tablet (800 mg total) by mouth every 8 (eight) hours as needed. (Patient not taking: Reported on 03/24/2018) 60 tablet 1  . ranitidine (ZANTAC) 150 MG tablet Take 1 tablet (150 mg total) by mouth 2 (two) times daily. 60 tablet 0   No facility-administered medications prior to visit.     No Known Allergies     Objective:    BP 101/69 (BP Location: Left Arm, Patient Position: Sitting, Cuff Size: Large)   Pulse 63   Temp 98.4 F (36.9 C) (Oral)   Ht 5\' 2"  (1.575 m)   Wt 218 lb 3.2 oz (99 kg)   SpO2 97%   BMI 39.91 kg/m  Wt Readings from Last 3 Encounters:  03/24/18 218 lb 3.2 oz (99 kg)  02/16/18 216 lb (98 kg)  08/15/15 203 lb 12.8 oz (92.4 kg)    Physical Exam  Constitutional: She is oriented to person, place, and time. She appears well-developed and well-nourished.  HENT:  Head: Normocephalic.  Cardiovascular: Normal rate, regular rhythm and normal heart sounds.  Pulmonary/Chest: Effort normal and breath sounds normal.  Abdominal: Soft. Bowel sounds are normal. Hernia  confirmed negative in the right inguinal area and confirmed negative in the left inguinal area.  Genitourinary: Rectum normal and uterus normal. Rectal exam shows no external hemorrhoid. No labial fusion. There is no rash, tenderness, lesion or injury on the right labia. There is no rash, tenderness, lesion or injury on the left labia. Uterus is not deviated and not enlarged. Cervix exhibits discharge. Cervix exhibits no motion tenderness and no friability. Right adnexum displays no mass, no tenderness and no fullness. Left adnexum displays no mass, no tenderness and no fullness. No erythema, tenderness or bleeding in the vagina. No foreign body in the vagina. No signs of injury around the vagina. Vaginal discharge found.    Lymphadenopathy: No inguinal adenopathy noted on the right or left side.       Right: No inguinal adenopathy present.       Left: No inguinal adenopathy present.  Neurological: She is alert and oriented to person, place, and time.  Skin: Skin is warm and dry.  Psychiatric: She has a normal mood and affect. Her behavior is normal. Judgment and thought content normal.       Patient has been counseled extensively about nutrition and exercise as well as the importance of adherence with medications and regular follow-up. The patient was given clear instructions to go to ER or return to medical center if symptoms don't improve, worsen or new problems develop. The patient verbalized understanding.   Follow-up: Return if symptoms worsen or fail to improve.   Gildardo Pounds, FNP-BC [facility] [place], [place]   03/24/2018, 9:43 AM

## 2018-03-26 LAB — CYTOLOGY - PAP
Bacterial vaginitis: NEGATIVE
CHLAMYDIA, DNA PROBE: NEGATIVE
Candida vaginitis: NEGATIVE
Diagnosis: NEGATIVE
HPV (WINDOPATH): NOT DETECTED
NEISSERIA GONORRHEA: NEGATIVE
TRICH (WINDOWPATH): NEGATIVE

## 2018-03-29 ENCOUNTER — Telehealth: Payer: Self-pay

## 2018-03-29 NOTE — Telephone Encounter (Signed)
-----   Message from Gildardo Pounds, NP sent at 03/26/2018  2:05 PM EDT ----- PAP smear is normal. Also there is no evidence of vaginal bacteria or yeast. Next PAP 2022

## 2018-03-29 NOTE — Telephone Encounter (Signed)
CMA attempt to call patient regarding lab results.  No answer and left a VM for patient.  If patient call back, please inform:  PAP smear is normal. Also there is no evidence of vaginal bacteria or yeast. Next PAP 2022

## 2019-02-18 ENCOUNTER — Other Ambulatory Visit: Payer: Self-pay

## 2019-02-18 ENCOUNTER — Ambulatory Visit (INDEPENDENT_AMBULATORY_CARE_PROVIDER_SITE_OTHER): Payer: Self-pay | Admitting: Family Medicine

## 2019-02-18 ENCOUNTER — Encounter: Payer: Self-pay | Admitting: Family Medicine

## 2019-02-18 VITALS — BP 116/74 | HR 78 | Temp 98.7°F | Ht 62.0 in | Wt 220.0 lb

## 2019-02-18 DIAGNOSIS — R339 Retention of urine, unspecified: Secondary | ICD-10-CM

## 2019-02-18 DIAGNOSIS — N644 Mastodynia: Secondary | ICD-10-CM

## 2019-02-18 NOTE — Progress Notes (Signed)
7/17/20203:41 PM  Cassidee Deats May 31, 1971, 48 y.o., female [id number]  Chief Complaint  Patient presents with  . Urine Output    not able to produce urine as normal in the past 2 month, drinks lots of water. Has pain in the stomach. Only takes multivitimins ans omego. No trouble producing stool, that is daily  . Pain    some pain in the left breast, no discharge    HPI:   Patient is a 48 y.o. female who presents today for problems with urination and right painful breast  H/o bladder mesh, rectal prolapse repair [facility] urology visit 2016 - [facility] - evaluated for bladder mesh erosion, plan was for cystoscopy but she was uninsured Pap 2019 - negative mammo 2016 - normal  Has been doing herbalife, when she started taking multivitamins She noticed that she was having difficulties with intermittent emptying her bladder in the morning with low urine outflow She stopped taking the mutlivitamins but symptoms have continued Denies any abd pain, vaginal discharge, constipation, hematuria, dysuria, dyspareunia, nausea, vomiting  Right breast pain,   Intermittent, sharp stabbing, nipple area Sometimes related to menses, but sometimes mid cycle No skin changes, no nipple discharge, no enlargement  Has gained about 50 lbs in past 2 years  Depression screen [facility] 2/9 02/18/2019 03/24/2018 08/15/2015  Decreased Interest 0 0 0  Down, Depressed, Hopeless 0 0 0  PHQ - 2 Score 0 0 0  Altered sleeping - 1 -  Tired, decreased energy - 1 -  Change in appetite - 0 -  Feeling bad or failure about yourself  - 0 -  Trouble concentrating - 0 -  Moving slowly or fidgety/restless - 0 -  Suicidal thoughts - 0 -  PHQ-9 Score - 2 -    Fall Risk  02/18/2019 08/22/2014  Falls in the past year? 0 No  Number falls in past yr: 0 -  Injury with Fall? 0 -     No Known Allergies  Prior to Admission medications   Medication Sig Start Date End Date Taking? Authorizing Provider  fish oil-omega-3 fatty  acids 1000 MG capsule Take 1 g by mouth daily. Reported on 08/15/2015   Yes [provider]  Multiple Vitamin (MULTIVITAMIN) tablet Take 1 tablet by mouth daily.     Yes [provider]  ranitidine (ZANTAC) 150 MG tablet Take 1 tablet (150 mg total) by mouth 2 (two) times daily. 03/29/12 03/29/13  Harrison Mons, PA    Past Medical History:  Diagnosis Date  . Fatty liver 2012  . Headache(784.0)   . Hepatic steatosis     Past Surgical History:  Procedure Laterality Date  . bladder mesh  2008  . BLADDER SURGERY    . CESAREAN SECTION     x2  . colon mesh    . RECTAL PROLAPSE REPAIR  2008  . TUBAL LIGATION      Social History   Tobacco Use  . Smoking status: Never Smoker  . Smokeless tobacco: Never Used  Substance Use Topics  . Alcohol use: No    Family History  Problem Relation Age of Onset  . Thyroid disease Mother   . Mental illness Mother        bipolar disorder, depression  . Breast cancer Cousin   . Cancer Maternal Aunt   . Cancer Maternal Aunt     ROS Per hpi  OBJECTIVE:  Today's Vitals   02/18/19 1532  BP: 116/74  Pulse: 78  Temp:  98.7 F (37.1 C)  TempSrc: Oral  SpO2: 95%  Weight: 220 lb (99.8 kg)  Height: _0  (1.575 m)   Body mass index is 40.24 kg/m.   Physical Exam Vitals signs and nursing note reviewed. Exam conducted with a chaperone present.  Constitutional:      Appearance: She is well-developed.  HENT:     Head: Normocephalic and atraumatic.     Right Ear: Hearing, tympanic membrane, ear canal and external ear normal.     Left Ear: Hearing, tympanic membrane, ear canal and external ear normal.  Eyes:     Conjunctiva/sclera: Conjunctivae normal.     Pupils: Pupils are equal, round, and reactive to light.  Neck:     Musculoskeletal: Neck supple.     Thyroid: No thyromegaly.  Cardiovascular:     Rate and Rhythm: Normal rate and regular rhythm.     Heart sounds: Normal heart sounds. No murmur. No friction rub. No  gallop.   Pulmonary:     Effort: Pulmonary effort is normal.     Breath sounds: Normal breath sounds. No wheezing, rhonchi or rales.  Chest:     Breasts: Breasts are symmetrical.        Right: Tenderness (lower outer quadrant) present. No inverted nipple, mass, nipple discharge or skin change.        Left: No inverted nipple, mass, nipple discharge, skin change or tenderness.  Abdominal:     General: Bowel sounds are normal. There is no distension.     Palpations: Abdomen is soft.     Tenderness: There is no abdominal tenderness. There is no guarding.  Genitourinary:    Labia:        Right: No rash or lesion.        Left: No rash or lesion.      Urethra: No prolapse or urethral swelling.     Vagina: No vaginal discharge or erythema.     Cervix: No cervical motion tenderness, discharge or friability.     Uterus: Not enlarged, not fixed, not tender and no uterine prolapse.      Adnexa:        Right: No mass or tenderness.         Left: No mass or tenderness.       Comments: About 43m area of mesh? Just past introitus, posterior wall No prolapse appreciated Musculoskeletal: Normal range of motion.  Lymphadenopathy:     Cervical: No cervical adenopathy.     Upper Body:     Right upper body: No supraclavicular, axillary or pectoral adenopathy.     Left upper body: No supraclavicular, axillary or pectoral adenopathy.  Skin:    General: Skin is warm and dry.  Neurological:     Mental Status: She is alert and oriented to person, place, and time.     Deep Tendon Reflexes: Reflexes are normal and symmetric.       ASSESSMENT and PLAN  1. Urinary retention Intermittent. Discussed avoidance of medications with anticholinergic properties. Consider urology referral, patient declining at this time as self pay - CMP14+EGFR - Urinalysis, Routine w reflex microscopic - [place]NAL; Future  2. Pain of right breast Discussed cutting back caffeine.  - Ambulatory Referral to [facility] - for  mammogram  Return for after studies.    IRutherford Guys MD [facility] [place] [id number].  [phone] [phone]

## 2019-02-18 NOTE — Patient Instructions (Signed)
Breast and Cervical Cancer Program for [facility]: 1. [place], Coordinator, [phone] 2. [address] regional, Randel Pigg, [place], [phone]

## 2019-02-19 LAB — URINALYSIS, ROUTINE W REFLEX MICROSCOPIC
Bilirubin, UA: NEGATIVE
Glucose, UA: NEGATIVE
Ketones, UA: NEGATIVE
Leukocytes,UA: NEGATIVE
Nitrite, UA: NEGATIVE
Protein,UA: NEGATIVE
RBC, UA: NEGATIVE
Specific Gravity, UA: 1.024 (ref 1.005–1.030)
Urobilinogen, Ur: 0.2 mg/dL (ref 0.2–1.0)
pH, UA: 5.5 (ref 5.0–7.5)

## 2019-02-19 LAB — CMP14+EGFR
ALT: 18 IU/L (ref 0–32)
AST: 18 IU/L (ref 0–40)
Albumin/Globulin Ratio: 1.6 (ref 1.2–2.2)
Albumin: 4.4 g/dL (ref 3.8–4.8)
Alkaline Phosphatase: 42 IU/L (ref 39–117)
BUN/Creatinine Ratio: 16 (ref 9–23)
BUN: 16 mg/dL (ref 6–24)
Bilirubin Total: 0.3 mg/dL (ref 0.0–1.2)
CO2: 22 mmol/L (ref 20–29)
Calcium: 9.4 mg/dL (ref 8.7–10.2)
Chloride: 103 mmol/L (ref 96–106)
Creatinine, Ser: 1 mg/dL (ref 0.57–1.00)
GFR calc Af Amer: 78 mL/min/{1.73_m2} (ref >59)
GFR calc non Af Amer: 67 mL/min/{1.73_m2} (ref >59)
Globulin, Total: 2.7 g/dL (ref 1.5–4.5)
Glucose: 83 mg/dL (ref 65–99)
Potassium: 4.1 mmol/L (ref 3.5–5.2)
Sodium: 141 mmol/L (ref 134–144)
Total Protein: 7.1 g/dL (ref 6.0–8.5)

## 2019-03-03 ENCOUNTER — Encounter: Payer: Self-pay | Admitting: Radiology

## 2019-03-08 ENCOUNTER — Ambulatory Visit
Admission: RE | Admit: 2019-03-08 | Discharge: 2019-03-08 | Disposition: A | Discharge status: Home / Self Care | Payer: No Typology Code available for payment source | Source: Ambulatory Visit | Attending: Family Medicine | Admitting: Family Medicine

## 2019-03-08 DIAGNOSIS — R339 Retention of urine, unspecified: Secondary | ICD-10-CM

## 2019-04-04 ENCOUNTER — Encounter: Payer: Self-pay | Admitting: Radiology

## 2019-08-09 ENCOUNTER — Other Ambulatory Visit: Payer: Self-pay

## 2019-08-09 NOTE — Progress Notes (Signed)
Phoned patient to prescreen for [facility] using two patient identifiers. To arrive at [facility] at  2:00 on 07/09/2020.

## 2019-08-10 ENCOUNTER — Other Ambulatory Visit: Payer: Self-pay

## 2019-08-10 ENCOUNTER — Ambulatory Visit
Admission: RE | Admit: 2019-08-10 | Discharge: 2019-08-10 | Disposition: A | Discharge status: Home / Self Care | Payer: Self-pay | Source: Ambulatory Visit | Attending: Oncology | Admitting: Oncology

## 2019-08-10 ENCOUNTER — Ambulatory Visit: Payer: Self-pay | Attending: Oncology

## 2019-08-10 DIAGNOSIS — Z0000 Encounter for general adult medical examination without abnormal findings: Secondary | ICD-10-CM

## 2019-08-10 DIAGNOSIS — N63 Unspecified lump in unspecified breast: Secondary | ICD-10-CM

## 2019-08-18 ENCOUNTER — Ambulatory Visit
Admission: RE | Admit: 2019-08-18 | Discharge: 2019-08-18 | Disposition: A | Discharge status: Home / Self Care | Payer: Self-pay | Source: Ambulatory Visit | Attending: Oncology | Admitting: Oncology

## 2019-08-18 DIAGNOSIS — N63 Unspecified lump in unspecified breast: Secondary | ICD-10-CM | POA: Insufficient documentation

## 2019-08-19 NOTE — Progress Notes (Signed)
Radilogist discussed Birads 2 findings wth patient.  Letter mailed from [facility] to notify of normal mammogram results.  Patient to return in one year for annual screening.  Copy to [facility].

## 2019-11-07 ENCOUNTER — Encounter (HOSPITAL_COMMUNITY): Payer: Self-pay

## 2019-11-07 ENCOUNTER — Other Ambulatory Visit: Payer: Self-pay

## 2019-11-07 ENCOUNTER — Ambulatory Visit (HOSPITAL_COMMUNITY)
Admission: EM | Admit: 2019-11-07 | Discharge: 2019-11-07 | Disposition: A | Discharge status: Home / Self Care | Payer: Self-pay | Attending: Internal Medicine | Admitting: Internal Medicine

## 2019-11-07 DIAGNOSIS — R338 Other retention of urine: Secondary | ICD-10-CM | POA: Insufficient documentation

## 2019-11-07 LAB — POCT URINALYSIS DIP (DEVICE)
Bilirubin Urine: NEGATIVE
Glucose, UA: NEGATIVE mg/dL
Hgb urine dipstick: NEGATIVE
Ketones, ur: NEGATIVE mg/dL
Leukocytes,Ua: NEGATIVE
Nitrite: NEGATIVE
Protein, ur: NEGATIVE mg/dL
Specific Gravity, Urine: 1.02 (ref 1.005–1.030)
Urobilinogen, UA: 0.2 mg/dL (ref 0.0–1.0)
pH: 6 (ref 5.0–8.0)

## 2019-11-07 LAB — BASIC METABOLIC PANEL
Anion gap: 9 (ref 5–15)
BUN: 15 mg/dL (ref 6–20)
CO2: 25 mmol/L (ref 22–32)
Calcium: 9 mg/dL (ref 8.9–10.3)
Chloride: 102 mmol/L (ref 98–111)
Creatinine, Ser: 0.58 mg/dL (ref 0.44–1.00)
GFR calc Af Amer: >60 mL/min (ref >60)
GFR calc non Af Amer: >60 mL/min (ref >60)
Glucose, Bld: 126 mg/dL — ABNORMAL HIGH (ref 70–99)
Potassium: 4.1 mmol/L (ref 3.5–5.1)
Sodium: 136 mmol/L (ref 135–145)

## 2019-11-07 MED ORDER — TAMSULOSIN HCL 0.4 MG PO CAPS: 0.4000 mg | ORAL_CAPSULE | Freq: Every day | ORAL | 0 refills | Status: DC

## 2019-11-07 NOTE — ED Provider Notes (Signed)
[place]    CSN: [id number] Arrival date & time: 11/07/19  [id number]      History   Chief Complaint Chief Complaint  Patient presents with  . unable to void    HPI Lauren Andersen is a 49 y.o. female comes to urgent care with complaints of lower abdominal pain, inability to void for the past 3 days.  Patient says that his symptoms started insidiously and is become persistent.  She is not able to void except for a few drops of urine every time she tries to void.  Abdominal pain is crampy in nature.  No known aggravating or relieving factors.  Pain was initially intermittent but has become progressively severe.  Is currently 10 out of 10.  She has some mild flank pain bilaterally.  No nausea or vomiting.  No dysuria.Marland Kitchen   HPI  Past Medical History:  Diagnosis Date  . Fatty liver 2012  . Headache(784.0)   . Hepatic steatosis     Patient Active Problem List   Diagnosis Date Noted  . Pain of both breasts 08/22/2014  . Pap smear for cervical cancer screening 08/22/2014  . 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 Hepatic steatosis 08/22/2014    Past Surgical History:  Procedure Laterality Date  . bladder mesh  2008  . BLADDER SURGERY    . CESAREAN SECTION     x2  . colon mesh    . RECTAL PROLAPSE REPAIR  2008  . TUBAL LIGATION      OB History    Gravida  4   Para  3   Term  2   Preterm  1   AB  1   Living  3     SAB  1   TAB      Ectopic      Multiple      Live Births               Home Medications    Prior to Admission medications   Medication Sig Start Date End Date Taking? Authorizing Provider  fish oil-omega-3 fatty acids 1000 MG capsule Take 1 g by mouth daily. Reported on 08/15/2015    [provider]  Multiple Vitamin (MULTIVITAMIN) tablet Take 1 tablet by mouth daily.      [provider]  ranitidine (ZANTAC) 150 MG tablet Take 1 tablet (150 mg total) by mouth 2 (two) times  daily. 03/29/12 03/29/13  Harrison Mons, PA  tamsulosin (FLOMAX) 0.4 MG CAPS capsule Take 1 capsule (0.4 mg total) by mouth daily. 11/07/19   LampteyMyrene Galas, MD    Family History Family History  Problem Relation Age of Onset  . Thyroid disease Mother   . Mental illness Mother        bipolar disorder, depression  . Breast cancer Cousin   . Cancer Maternal Aunt   . Cancer Maternal Aunt     Social History Social History   Tobacco Use  . Smoking status: Never Smoker  . Smokeless tobacco: Never Used  Substance Use Topics  . Alcohol use: No  . Drug use: No     Allergies   Patient has no known allergies.   Review of Systems Review of Systems  Constitutional: Negative.   Genitourinary: Positive for difficulty urinating, flank pain and urgency. Negative for dysuria, hematuria, menstrual problem, pelvic pain and vaginal discharge.  Musculoskeletal: Negative for arthralgias and joint swelling.  Neurological: Negative.  Physical Exam Triage Vital Signs ED Triage Vitals  Enc Vitals Group     BP 11/07/19 0924 (!) 153/108     Pulse Rate 11/07/19 0924 72     Resp 11/07/19 0924 18     Temp 11/07/19 0924 98.2 F (36.8 C)     Temp src --      SpO2 11/07/19 0924 100 %     Weight 11/07/19 0922 230 lb 9.6 oz (104.6 kg)     Height --      Head Circumference --      Peak Flow --      Pain Score 11/07/19 0922 10     Pain Loc --      Pain Edu? --      Excl. in [place]? --    No data found.  Updated Vital Signs BP (!) 153/108 (BP Location: Right Arm)   Pulse 72   Temp 98.2 F (36.8 C)   Resp 18   Wt 104.6 kg   LMP 08/30/2019   SpO2 100%   BMI 42.18 kg/m   Visual Acuity Right Eye Distance:   Left Eye Distance:   Bilateral Distance:    Right Eye Near:   Left Eye Near:    Bilateral Near:     Physical Exam Vitals and nursing note reviewed. Exam conducted with a chaperone present.  Constitutional:      General: She is in acute distress.  HENT:     Right Ear:  Tympanic membrane normal.     Left Ear: Tympanic membrane normal.  Pulmonary:     Effort: Pulmonary effort is normal.     Breath sounds: Normal breath sounds. No wheezing or rhonchi.  Genitourinary:    General: Normal vulva.  Musculoskeletal:        General: Normal range of motion.  Skin:    General: Skin is warm.     Capillary Refill: Capillary refill takes less than 2 seconds.     Coloration: Skin is not jaundiced.     Findings: No bruising or erythema.  Neurological:     General: No focal deficit present.     Mental Status: She is alert and oriented to person, place, and time.      UC Treatments / Results  Labs (all labs ordered are listed, but only abnormal results are displayed) Labs Reviewed  BASIC METABOLIC PANEL  POCT URINALYSIS DIP (DEVICE)    EKG   Radiology No results found.  Procedures Procedures (including critical care time)  Medications Ordered in UC Medications - No data to display  Initial Impression / Assessment and Plan / UC Course  I have reviewed the triage vital signs and the nursing notes.  Pertinent labs & imaging results that were available during my care of the patient were reviewed by me and considered in my medical decision making (see chart for details).     1.  Acute retention of urine: In- and out- Foley catheterization was done About 1500 cc of nonbloody urine was drained Flomax given Point-of-care urinalysis is negative for UTI or hematuria BM P has been sent Return precautions given If symptoms recur patient will need urogynecology evaluation.   Final Clinical Impressions(s) / UC Diagnoses   Final diagnoses:  Acute urinary retention   Discharge Instructions   None    ED Prescriptions    Medication Sig Dispense Auth. Provider   tamsulosin (FLOMAX) 0.4 MG CAPS capsule Take 1 capsule (0.4 mg total) by mouth daily. 30 capsule  Gabriela Irigoyen, Myrene Galas, MD     PDMP not reviewed this encounter.   Chase Picket,  MD 11/07/19 [phone]

## 2019-11-07 NOTE — ED Triage Notes (Signed)
Pt state she has pain and she has not been  able to void. X 3 days .  Pt states the pain is serve.

## 2020-06-25 ENCOUNTER — Ambulatory Visit (INDEPENDENT_AMBULATORY_CARE_PROVIDER_SITE_OTHER): Payer: Self-pay | Admitting: Family Medicine

## 2020-06-25 ENCOUNTER — Other Ambulatory Visit: Payer: Self-pay

## 2020-06-25 ENCOUNTER — Encounter: Payer: Self-pay | Admitting: Family Medicine

## 2020-06-25 VITALS — BP 112/74 | HR 73 | Temp 98.4°F | Ht 62.0 in | Wt 226.0 lb

## 2020-06-25 DIAGNOSIS — M7989 Other specified soft tissue disorders: Secondary | ICD-10-CM

## 2020-06-25 DIAGNOSIS — Z1329 Encounter for screening for other suspected endocrine disorder: Secondary | ICD-10-CM

## 2020-06-25 DIAGNOSIS — Z13220 Encounter for screening for lipoid disorders: Secondary | ICD-10-CM

## 2020-06-25 DIAGNOSIS — Z1239 Encounter for other screening for malignant neoplasm of breast: Secondary | ICD-10-CM

## 2020-06-25 DIAGNOSIS — Z1211 Encounter for screening for malignant neoplasm of colon: Secondary | ICD-10-CM

## 2020-06-25 DIAGNOSIS — Z1321 Encounter for screening for nutritional disorder: Secondary | ICD-10-CM

## 2020-06-25 DIAGNOSIS — Z0000 Encounter for general adult medical examination without abnormal findings: Secondary | ICD-10-CM

## 2020-06-25 DIAGNOSIS — Z131 Encounter for screening for diabetes mellitus: Secondary | ICD-10-CM

## 2020-06-25 LAB — LIPID PANEL

## 2020-06-25 NOTE — Progress Notes (Addendum)
11/22/202111:21 AM  Lauren Andersen 1971-03-21, 49 y.o., female [id number]  Chief Complaint  Patient presents with  . head and neck pain    sharp pain that went up to temple / subsided this saturday   . left leg swelling    2 months     HPI:   Patient is a 49 y.o. female with past medical history significant for  who presents today for transfer of care.  Interpreter services used for this apointment  Left Leg swelling Left leg knee down swelling Started 2-3 months ago  Hurts from knee down No knee pain all mid calf Started out of nowhere  Denies worsening of symptoms Denies injury, negative homan's sign Denies SOB, Chest pain, recent travel,  swelling both ankles has been going on for awhile  Sharp pain Started back of neck to right side of head She tried ear drops twice but no improvement It lasted for 3 days Never happened before in the past Increased pain due to body mechanics  Screening Mammogram: 08/18/19: needs 1 year Pap: 03/2018 nil and HPV negative Colon Cancer screen: No family history,   Appointment in January planned. Will need surgery for this Mesh is coming out in pieces H/o bladder mesh, rectal prolapse repair [facility] 2016 [facility] - evaluated for bladder mesh erosion, plan was for cystoscopy but she was uninsured Dr. Zigmund Daniel for erosin of vaginal mesh, pelvic pressure, itching and urinary hesitancy, pressure from fibroids.    Recently gained a lot of weight Occasionally Right abdominal pain  Pre-Menopause Had a period in May and again in November   Depression screen [facility] 2/9 06/25/2020 02/18/2019 03/24/2018  Decreased Interest 0 0 0  Down, Depressed, Hopeless 0 0 0  PHQ - 2 Score 0 0 0  Altered sleeping - - 1  Tired, decreased energy - - 1  Change in appetite - - 0  Feeling bad or failure about yourself  - - 0  Trouble concentrating - - 0  Moving slowly or fidgety/restless - - 0  Suicidal thoughts - - 0  PHQ-9  Score - - 2    Fall Risk  06/25/2020 02/18/2019 08/22/2014  Falls in the past year? 0 0 No  Number falls in past yr: 0 0 -  Injury with Fall? 0 0 -  Follow up Falls evaluation completed - -     No Known Allergies  Prior to Admission medications   Medication Sig Start Date End Date Taking? Authorizing Provider  fish oil-omega-3 fatty acids 1000 MG capsule Take 1 g by mouth daily. Reported on 08/15/2015   Yes [provider]  Multiple Vitamin (MULTIVITAMIN) tablet Take 1 tablet by mouth daily.     Yes [provider]  ranitidine (ZANTAC) 150 MG tablet Take 1 tablet (150 mg total) by mouth 2 (two) times daily. Patient not taking: Reported on 06/25/2020 03/29/12 03/29/13  Harrison Mons, PA  tamsulosin (FLOMAX) 0.4 MG CAPS capsule Take 1 capsule (0.4 mg total) by mouth daily. Patient not taking: Reported on 06/25/2020 11/07/19   Chase Picket, MD    Past Medical History:  Diagnosis Date  . Fatty liver 2012  . Headache(784.0)   . Hepatic steatosis     Past Surgical History:  Procedure Laterality Date  . bladder mesh  2008  . BLADDER SURGERY    . CESAREAN SECTION     x2  . colon mesh    . RECTAL PROLAPSE REPAIR  2008  .  TUBAL LIGATION      Social History   Tobacco Use  . Smoking status: Never Smoker  . Smokeless tobacco: Never Used  Substance Use Topics  . Alcohol use: No    Family History  Problem Relation Age of Onset  . Thyroid disease Mother   . Mental illness Mother        bipolar disorder, depression  . Breast cancer Cousin   . Cancer Maternal Aunt   . Cancer Maternal Aunt     Review of Systems  Constitutional: Negative for chills, fever and malaise/fatigue.  Eyes: Negative for blurred vision and double vision.  Respiratory: Negative for cough, shortness of breath and wheezing.   Cardiovascular: Positive for leg swelling. Negative for chest pain and palpitations.  Gastrointestinal: Negative for abdominal pain, blood in stool,  constipation, diarrhea, heartburn, nausea and vomiting.  Genitourinary: Negative for dysuria, frequency and hematuria.  Musculoskeletal: Negative for back pain and joint pain.  Skin: Negative for rash.  Neurological: Negative for dizziness, weakness and headaches.     OBJECTIVE:  Today's Vitals   06/25/20 1012  BP: 112/74  Pulse: 73  Temp: 98.4 F (36.9 C)  SpO2: 97%  Weight: 226 lb (102.5 kg)  Height: $Remove'5\' 2"'pxiyvVF$  (1.575 m)   Body mass index is 41.34 kg/m.   Physical Exam Constitutional:      General: She is not in acute distress.    Appearance: Normal appearance. She is not ill-appearing.  HENT:     Head: Normocephalic.     Right Ear: Tympanic membrane, ear canal and external ear normal.     Left Ear: Tympanic membrane, ear canal and external ear normal.  Cardiovascular:     Rate and Rhythm: Normal rate and regular rhythm.     Pulses: Normal pulses.     Heart sounds: Normal heart sounds. No murmur heard.  No friction rub. No gallop.   Pulmonary:     Effort: Pulmonary effort is normal. No respiratory distress.     Breath sounds: Normal breath sounds. No stridor. No wheezing, rhonchi or rales.  Abdominal:     General: Bowel sounds are normal.     Palpations: Abdomen is soft.     Tenderness: There is no abdominal tenderness.  Musculoskeletal:     Right lower leg: No tenderness. 2+ Edema present.     Left lower leg: Swelling (highly visible small vasculature towards knee with slight errythema) and tenderness present. No bony tenderness. 3+ Edema present.  Skin:    General: Skin is warm and dry.  Neurological:     Mental Status: She is alert and oriented to person, place, and time.  Psychiatric:        Mood and Affect: Mood normal.        Behavior: Behavior normal.     No results found for this or any previous visit (from the past 24 hour(s)).  No results found.   ASSESSMENT and PLAN  Problem List Items Addressed This Visit    None    Visit Diagnoses     Encounter for breast cancer screening using non-mammogram modality    -  Primary   Relevant Orders   MM DIGITAL SCREENING BILATERAL   Screening for lipid disorders       Relevant Orders   Lipid Panel   Colon cancer screening       Relevant Orders   Fecal occult blood, imunochemical   Thyroid disorder screen       Relevant Orders  TSH   Screening for diabetes mellitus       Relevant Orders   Hemoglobin A1c   Encounter for vitamin deficiency screening       Relevant Orders   Vitamin D, 25-hydroxy   Left leg swelling       Relevant Orders   US Venous Img Lower Unilateral Left   CMP14+EGFR   US ARTERIAL LOWER EXTREMITY DUPLEX LEFT (NON-ABI)  Discussed RTC/ED precautions Will follow up with Ultrasound results Denies pain, SOB, Chest pain, signs of infection.   Annual physical exam       Relevant Orders   CBC Will follow up with lab results.     Ordred colon cancer screening test and mammogram. Education provided.  Return in about 1 month (around 07/25/2020) for Left leg swelling and ultrasound follow up.    Huston Foley Deziray Nabi, FNP-BC [facility] [place], [place] [address] Ph.  [phone] Fax [phone]

## 2020-06-25 NOTE — Patient Instructions (Addendum)
Recuento de caloras para bajar de peso Calorie Counting for [employer] Loss Las caloras son unidades de Teacher, early years/pre. Su cuerpo necesita una cierta cantidad de caloras de los alimentos para que le ayuden a Company secretary. Cuando come ms caloras de las que el cuerpo necesita, este acumula las caloras extra [place]. Cuando come [employer] de las que el cuerpo [place], este quema grasa para obtener la energa que requiere. El recuento de caloras es el registro de la cantidad de caloras que come y Pharmacologist. El recuento de caloras puede ser de ayuda si necesita perder peso. Si se asegura de comer menos caloras de las que el cuerpo necesita, debe bajar de [place]. Pregntele al mdico cul es un peso sano para usted. Para que el recuento de caloras funcione, tendr que comer la cantidad de caloras adecuadas para usted en un da, para bajar una cantidad de peso saludable por semana. Un nutricionista puede determinar la cantidad de caloras que necesita por da y sugerirle cmo alcanzar su objetivo calrico.  Una cantidad de peso saludable para bajar por semana suele ser [place] 1 y Ivar Drape (0,5 a 0,9kg). Esto significa con frecuencia que su ingesta diaria de caloras se debera reducir unas 500 a 750caloras.  Ingerir 1200 a 1500caloras por Administrator, Civil Service a la [place] a Administrator, Civil Service.  Ingerir de 1500 a 1800caloras por Administrator, Civil Service a la [employer] de los hombres a Administrator, Civil Service. En qu consiste el plan? Mi objetivo es comer __________ Raenette Rover da. Si como esta cantidad de caloras por da, debo bajar unas __________ Terrall Laity. Qu debo saber acerca del recuento de caloras? A fin de alcanzar su objetivo diario de caloras, tendr que:  Averiguar cuntas caloras hay en cada alimento que le Therapist, occupational. Intente hacerlo antes de comer.  Decida la cantidad que puede comer del alimento.  Anote lo que comi y cuntas caloras tena. Esta tarea se conoce  como llevar un registro de comidas. Para perder peso con xito es importante equilibrar el recuento de caloras con un estilo de vida saludable que incluya actividad fsica de forma regular. Tenga un objetivo de 180minutos de ejercicio moderado (como caminar) o 75 minutos de ejercicio vigoroso (como correr) todas las semanas. Dnde encuentro informacin sobre las caloras?  Es posible Animator cantidad de caloras que contiene un alimento en la etiqueta de informacin nutricional. Si un alimento no tiene una etiqueta de informacin nutricional, intente buscar las caloras en Internet o pida ayuda al nutricionista. Recuerde que las caloras se calculan por porcin. Si opta por comer ms de una porcin de un alimento, tendr [employer] las caloras por porcin por la cantidad de porciones que planea comer. Por ejemplo, la etiqueta de un envase de pan puede decir que el tamao de una porcin es [place], y que una porcin tiene 90caloras. Si come 1rodaja, habr comido 90caloras. Si come 2rodajas, habr comido 180caloras. Cmo llevo un registro de comidas? Despus de cada comida, registre la siguiente informacin en el registro de comidas:  Lo que comi. No olvide incluir los aderezos, las salsas y otros extras de la comida.  La cantidad que comi. Esto se puede medir en tazas, onzas o cantidad de alimentos.  Cuntas caloras ingiri por comida y por bebida.  La cantidad total de caloras en la comida. Tenga a Materials engineer de comidas, por ejemplo, en un anotador de bolsillo o utilice una aplicacin mvil o sitio web. Algunos  programas calcularn las caloras y [employer] la cantidad de caloras que le quedan para llegar al objetivo diario. Cules son algunos consejos para el recuento de caloras?   Use las caloras de los alimentos y las bebidas que lo sacien y no lo dejen con apetito: ? Algunos ejemplos de alimentos que lo sacian son los frutos secos y Engineer, mining de  frutos secos, verduras, Advertising account planner y Clinical research associate con alto contenido de Pharmacist, hospital como los cereales integrales. Los alimentos con alto contenido de [place] son aquellos que tienen ms de 5g de fibra por porcin. ? Las [employer] refrescos, especialmente las bebidas a base de caf y los jugos, que contienen muchas caloras, pero no le dan saciedad.  Coma alimentos nutritivos y evite las caloras vacas. Las caloras vacas son aquellas que se obtienen de los alimentos o las bebidas que no contienen muchos nutrientes ni protenas, como los dulces y los refrescos. Es mejor comer una comida nutritiva altamente calrica (como un aguacate) que una con pocos nutrientes (como una bolsa de patatas fritas).  Sepa cuntas caloras tienen los alimentos que come con ms frecuencia. Esto le ayudar a contar las caloras ms rpidamente.  Preste atencin a las [employer]. Las bebidas de bajas caloras incluyen agua y refrescos sin Location manager.  Preste atencin a las etiquetas nutricionales de alimentos "bajos en grasas" o "sin grasas". Estos alimentos a veces tienen la misma cantidad de caloras o ms caloras que las versiones ricas en grasa. Con frecuencia, tambin tienen agregados de azcar, almidn o sal, para darles el sabor que fue eliminado con la grasa.  Encuentre un mtodo para controlar las caloras que funcione para usted. Sea creativo. Pruebe aplicaciones o programas distintos, si llevar un registro de las caloras no funciona para usted. Cules son algunos consejos para controlar las porciones?  Sepa cuntas caloras hay en una porcin. Esto lo ayudar a saber cuntas porciones de un alimento determinado puede comer.  Use una taza medidora para medir los tamaos de las porciones. Tambin Secondary school teacher las porciones en una balanza de cocina. Con el tiempo, podr hacer un clculo estimativo de los tamaos de las porciones de algunos alimentos.  Dedique tiempo a poner porciones de  diferentes alimentos en sus platos, tazones y tazas predilectos, a fin de saber cmo se ve una porcin.  Intente no comer directamente de una bolsa o una caja. Esto puede llevarlo a comer en exceso. Ponga la cantidad [employer] gustara comer en una taza o un plato, a fin de asegurarse de que est comiendo la porcin correcta.  Use platos, vasos y tazones ms pequeos para no comer en exceso.  Intente no realizar varias tareas al [employer] (como mirar la TV o usar su computadora) Addy come. Si es la hora de comer, sintese a Conservation officer, nature y disfrute de Environmental education officer. Esto lo ayudar a Marine scientist cundo est satisfecho. Tambin le ayudar a tomar conciencia de lo que est comiendo y de la cantidad. Cules son algunos consejos para seguir este plan? Lectura de las etiquetas de los alimentos  Controle el recuento de caloras en comparacin con el tamao de la porcin. El tamao de la porcin puede ser ms pequeo de lo que suele comer.  Verifique la fuente de las caloras. Asegrese de que la comida que ingiere tenga alto contenido de vitaminas y protenas y sea baja en grasas saturadas y grasas trans. De compras  Lea las etiquetas nutricionales cuando compre. Esto le ayudar a tomar  decisiones ms saludables antes de comprar una comida.  Haga una lista para el almacn y resptela. La coccin  Intente cocinar sus alimentos preferidos de una manera ms saludable. Por ejemplo, pruebe hornear en vez de frer.  Utilice productos lcteos descremados. Planificacin de los alimentos  Utilice ms frutas y verduras. La mitad de sus platos debe ser de frutas y verduras.  Incluya protenas [employer] y el pescado. Cmo puedo hacer el recuento de caloras cuando como afuera?  Pida porciones ms pequeas.  Considere la posibilidad de Publishing rights manager un plato principal y las guarniciones, en lugar de pedir su propio plato principal.  Si pide su propio plato principal, coma solo la mitad. Pida una caja  al comienzo de la comida y ponga all el resto del plato principal, para no sentir la tentacin de comerlo.  Si se detallan las caloras en el men, elija las opciones que contengan la menor cantidad.  Elija platos que incluyan verduras, frutas, cereales integrales, productos lcteos con bajo contenido de grasa y Advertising account planner.  Opte por los alimentos hervidos, asados, cocidos a la parrilla o al vapor. No coma alimentos que contengan mantequilla, estn empanados, fritos o que se sirvan con salsa a base de crema. Generalmente, los alimentos que se etiquetan como "crujientes" estn fritos, a menos que se indique lo contrario.  Elija el agua, la [place], [place] t helado sin azcar u otras bebidas que no contengan azcares agregados. Si desea una bebida alcohlica, escoja una opcin con menos caloras como una copa de vino o una cerveza ligera.  Ordene los [employer], las salsas y los jarabes aparte. Estos son, con frecuencia, de alto contenido en caloras, por lo que debe limitar la cantidad que ingiere.  Si desea [place], elija una de hortalizas y pida carnes a la parrilla. Evite las guarniciones adicionales como el tocino, el queso o los alimentos fritos. Ordene el aderezo aparte o pida aceite de [place] y vinagre o limn para Haematologist.  Haga un clculo estimativo de la cantidad de porciones que le sirven. Por ejemplo, una porcin de arroz cocido equivale a media taza o la mitad del tamao de una pelota de bisbol. Conocer el tamao de las porciones lo ayudar a Personnel officer atento a la cantidad de comida que come [employer]. La lista que sigue le [place] el tamao de algunas porciones comunes a partir de objetos cotidianos: ? 1onza (28g) = 4dados apilados. ? 3onzas (85g) = 23mazo de cartas. ? 1cucharadita = 1dado. ? 1cucharada = media pelota de tenis de mesa. ? 2cucharadas = 1pelota de tenis de mesa. ? Media taza = media pelota de bisbol. ? 1taza = 1 pelota de  bisbol. Resumen  El recuento de caloras es el registro de la cantidad de caloras que come y Pharmacologist. Si come menos caloras de las que el cuerpo necesita, debe bajar de [place].  Una cantidad de peso saludable para bajar por semana suele ser [place] 1 y Ivar Drape (0,5 a 0,9kg). Esto significa, con frecuencia, reducir su ingesta diaria de caloras unas 500 a 750 caloras.  Es posible Animator cantidad de caloras que contiene un alimento en la etiqueta de informacin nutricional. Si un alimento no tiene una etiqueta de informacin nutricional, intente buscar las caloras en Internet o pida ayuda al nutricionista.  Use las caloras de los alimentos y las bebidas que lo sacien y no de los alimentos y las bebidas que lo dejan con apetito.  Use platos, vasos  y tazones ms pequeos para no comer en exceso. Esta informacin no tiene Marine scientist el consejo del mdico. Asegrese de hacerle al mdico cualquier pregunta que tenga. Document Revised: 10/20/2016 Document Reviewed: 10/20/2016 Elsevier Patient Education  [place] deteccin de Surveyor, minerals Colorectal Cancer Screening  Las pruebas de deteccin de cncer colorrectal se usan para confirmar o descartar este tipo de cncer antes de que se desarrollen los sntomas. Colorrectal se refiere al colon y al recto. El colon y el recto se encuentran al final del tubo digestivo y all se eliminan las deposiciones hacia fuera del cuerpo. Quin debe realizarse la prueba de deteccin? [employer] adultos a Proofreader de los 10 aos [facility] 75 aos deben hacerse pruebas de Programme researcher, broadcasting/film/video. El mdico puede recomendarle las pruebas de deteccin a partir de los 70 aos. Le realizarn pruebas cada 1 a 10 aos, segn los Winn y el tipo de prueba de Programme researcher, broadcasting/film/video. Puede realizarse pruebas de deteccin a partir de una edad ms temprana o con ms frecuencia que Standard Pacific, si usted tiene alguno de los siguientes factores de  riesgo:  Antecedentes personales o familiares de cncer colorrectal o bultos anormales (plipos).  Una enfermedad inflamatoria del intestino, como colitis ulcerosa o enfermedad de Crohn.  Antecedentes de radioterapia en el abdomen o rea plvica para tratamiento de cncer.  Sntomas de Surveyor, minerals, como cambios en los hbitos intestinales o sangre en las heces.  Un tipo de sndrome de cncer de colon que se transmite de padres a hijos (hereditario), como: ? Sndrome de Field seismologist. ? Poliposis adenomatosa familiar. ? Sndrome de [place]. ? Sndrome de Peutz-Jeghers. Las recomendaciones de pruebas de Programme researcher, broadcasting/film/video para los adultos que tienen entre 64 y 29 aos de edad [place] segn la salud. Cmo se realiza este estudio? Hay diversos tipos de pruebas de Manufacturing systems engineer. Pueden hacerle una o ms de las siguientes:  Prueba de sangre oculta en la materia fecal con guayacol. Para realizar esta prueba, se examina una muestra de heces (materia fecal) a fin de Consulting civil engineer oculta (escondida), lo que podra ser un signo de Surveyor, minerals.  Prueba inmunoqumica fecal (PIF). Para realizar esta prueba, se examina Truddie Coco de heces a fin de detectar la presencia de [place], lo que podra ser un signo de Surveyor, minerals.  Prueba de cido desoxirribonucleico (ADN) en heces. Para realizar esta prueba, se examina Truddie Coco de heces a fin de Product manager presencia de sangre y cambios en el ADN que podran Cabin crew.  Sigmoidoscopa. Durante esta prueba, se utiliza un tubo flexible y delgado con una cmara en el extremo (sigmoidoscopio) para examinar el recto y la parte inferior del colon.  Colonoscopa. Durante esta prueba, se utiliza un tubo largo y flexible con una cmara en el extremo (colonoscopio) para examinar todo el colon y el recto. Mediante una colonoscopa es posible tomar una muestra de tejido (biopsia) y extirpar pequeos plipos durante la  prueba.  Colonoscopa virtual. En lugar de un colonoscopio, en este tipo de colonoscopa se utilizan radiografas (exploracin por tomografa computarizada (TC)) y computadoras para generar imgenes del colon y el recto. Cules son los beneficios de las pruebas de deteccin? Las pruebas de deteccin reducen el riesgo de cncer colorrectal y pueden ayudar a identificar el cncer en una etapa temprana, cuando se puede extirpar o tratar con mayor facilidad. Es comn que se formen plipos en el revestimiento del colon, especialmente a medida que envejece. Estos plipos pueden  ser cancerosos o volverse cancerosos con el tiempo. Las pruebas de deteccin pueden identificar estos plipos. Cules son los riesgos de la prueba de deteccin? Cada prueba de deteccin puede [employer].  Las pruebas de muestras de heces tienen menos riesgos que otros tipos de pruebas de Programme researcher, broadcasting/film/video. Sin embargo, es posible que deba realizarse ms pruebas para [employer] Edison de [place] prueba de [place] de [place].  Las pruebas de deteccin con radiografas lo exponen a niveles bajos de radiacin, lo que puede aumentar ligeramente el riesgo de Hotel manager. El beneficio de Futures trader cncer supera el ligero aumento del [place].  Las pruebas de Youth worker la sigmoidoscopa y Engineer, materials colonoscopa pueden conllevar riesgo de sangrado, dao intestinal, infeccin o una reaccin a los medicamentos administrados durante el examen. Consulte a su mdico para comprender su riesgo de Therapist, music y para elaborar un plan de deteccin que sea adecuado para usted. Preguntas para hacerle al mdico  Cundo debo comenzar con las pruebas de deteccin del cncer colorrectal?  Cul es mi riesgo de Best boy cncer colorrectal?  Con qu frecuencia tengo que realizarme pruebas de deteccin?  Qu pruebas de deteccin tengo que realizarme?  Cmo obtengo los resultados de la prueba?  [place] significan los resultados? Dnde  buscar ms informacin Obtenga ms informacin sobre las pruebas de deteccin del Surveyor, minerals en los siguientes sitios:  Sociedad Museum/gallery conservator ([place]): www.cancer.[place] ([place]): www.cancer.gov Resumen  Las pruebas de deteccin de Surveyor, minerals se usan para Firefighter o Teaching laboratory technician tipo de cncer antes de que se desarrollen los sntomas.  Las pruebas de deteccin reducen el riesgo de cncer colorrectal y pueden ayudar a identificar el cncer en una etapa temprana, cuando se puede extirpar o tratar con mayor facilidad.  [employer] of Omaha adultos a Proofreader de los 50 aos [facility] 75 aos deben hacerse pruebas de Programme researcher, broadcasting/film/video. El mdico puede recomendarle las pruebas de deteccin a partir de los 59 aos.  Puede realizarse pruebas de deteccin a partir de una edad ms temprana o con ms frecuencia que Standard [employer], si usted tiene determinados factores de Sales executive.  Consulte a su mdico para comprender su riesgo de Therapist, music y para elaborar un plan de deteccin que sea adecuado para usted. Esta informacin no tiene Marine scientist el consejo del mdico. Asegrese de hacerle al mdico cualquier pregunta que tenga. Document Revised: 08/26/2017 Document Reviewed: 06/19/2017 Elsevier Patient Education  [employer] [place] [employer].   If you have lab work done today you will be contacted with your lab results within the next 2 weeks.  If you have not heard from [place] then please contact us. The fastest way to get your results is to register for My Chart.   IF you received an x-ray today, you will receive an invoice from [facility]. Please contact [facility] at [phone] with questions or concerns regarding your invoice.   IF you received labwork today, you will receive an invoice from [address]. Please contact [facility] at [phone] with questions or concerns regarding your invoice.    Our billing staff will not be able to assist you with questions regarding bills from these companies.  You will be contacted with the lab results as soon as they are available. The fastest way to get your results is to activate your My Chart account. Instructions are located on the last page of this paperwork. If you have not heard from [place] regarding the results  in 2 weeks, please contact this office.

## 2020-06-26 LAB — LIPID PANEL
Chol/HDL Ratio: 3 ratio (ref 0.0–4.4)
Cholesterol, Total: 190 mg/dL (ref 100–199)
HDL: 64 mg/dL (ref >39)
LDL Chol Calc (NIH): 107 mg/dL — ABNORMAL HIGH (ref 0–99)
Triglycerides: 104 mg/dL (ref 0–149)
VLDL Cholesterol Cal: 19 mg/dL (ref 5–40)

## 2020-06-26 LAB — CMP14+EGFR
ALT: 18 IU/L (ref 0–32)
AST: 14 IU/L (ref 0–40)
Albumin/Globulin Ratio: 1.7 (ref 1.2–2.2)
Albumin: 4.5 g/dL (ref 3.8–4.8)
Alkaline Phosphatase: 46 IU/L (ref 44–121)
BUN/Creatinine Ratio: 22 (ref 9–23)
BUN: 13 mg/dL (ref 6–24)
Bilirubin Total: 0.3 mg/dL (ref 0.0–1.2)
CO2: 21 mmol/L (ref 20–29)
Calcium: 9.3 mg/dL (ref 8.7–10.2)
Chloride: 104 mmol/L (ref 96–106)
Creatinine, Ser: 0.6 mg/dL (ref 0.57–1.00)
GFR calc Af Amer: 124 mL/min/{1.73_m2} (ref >59)
GFR calc non Af Amer: 107 mL/min/{1.73_m2} (ref >59)
Globulin, Total: 2.7 g/dL (ref 1.5–4.5)
Glucose: 101 mg/dL — ABNORMAL HIGH (ref 65–99)
Potassium: 4.4 mmol/L (ref 3.5–5.2)
Sodium: 140 mmol/L (ref 134–144)
Total Protein: 7.2 g/dL (ref 6.0–8.5)

## 2020-06-26 LAB — CBC
Hematocrit: 40.2 % (ref 34.0–46.6)
Hemoglobin: 13.3 g/dL (ref 11.1–15.9)
MCH: 29.3 pg (ref 26.6–33.0)
MCHC: 33.1 g/dL (ref 31.5–35.7)
MCV: 89 fL (ref 79–97)
Platelets: 259 10*3/uL (ref 150–450)
RBC: 4.54 x10E6/uL (ref 3.77–5.28)
RDW: 12.6 % (ref 11.7–15.4)
WBC: 6 10*3/uL (ref 3.4–10.8)

## 2020-06-26 LAB — HEMOGLOBIN A1C
Est. average glucose Bld gHb Est-mCnc: 120 mg/dL
Hgb A1c MFr Bld: 5.8 % — ABNORMAL HIGH (ref 4.8–5.6)

## 2020-06-26 LAB — TSH: TSH: 1.06 u[IU]/mL (ref 0.450–4.500)

## 2020-06-26 LAB — VITAMIN D 25 HYDROXY (VIT D DEFICIENCY, FRACTURES): Vit D, 25-Hydroxy: 25.8 ng/mL — ABNORMAL LOW (ref 30.0–100.0)

## 2020-06-26 NOTE — Progress Notes (Signed)
If you could let Lauren Andersen know her A1c and cholesterol are elevated. This places her in the pre-diabetes range. No medications are needed at this time, but continue to work on increasing exercise and improving diet. Her vitamin D is low. If she could take 1000 IU of over the counter Vitamin D3 with food daily this should improve.

## 2020-06-27 ENCOUNTER — Telehealth: Payer: Self-pay

## 2020-06-27 NOTE — Telephone Encounter (Signed)
Pt has come to office and dropped her sample off and I have printed the rec. Pt stated understanding and left.

## 2020-06-28 LAB — FECAL OCCULT BLOOD, IMMUNOCHEMICAL: Fecal Occult Bld: NEGATIVE

## 2020-07-05 ENCOUNTER — Telehealth: Payer: Self-pay | Admitting: Family Medicine

## 2020-07-05 NOTE — Telephone Encounter (Signed)
Pt called and stated she discussed having an ultra sound on her leg. No ultra sound has been put in. Please advise.

## 2020-07-06 NOTE — Telephone Encounter (Signed)
Called and spoke to pt her condition is worsening needs [place] scheduled ASAP thank you

## 2020-07-10 ENCOUNTER — Other Ambulatory Visit: Payer: Self-pay | Admitting: Family Medicine

## 2020-07-10 DIAGNOSIS — M7989 Other specified soft tissue disorders: Secondary | ICD-10-CM

## 2020-07-13 NOTE — Telephone Encounter (Signed)
Pt is calling back again regarding the  status  if referral .   Please advise pt of status . Patient is tried of waiting   Patient is worried  thinking maybe they didn't call  Because she has no insurance and states she can pay . Not to hold this against her

## 2020-07-16 NOTE — Telephone Encounter (Signed)
I have called the Vascular office again to try to get pt an appointment to get her ultra soul. There was no answer so I left a message to call back once more.  I will try once more and then I will let the pt know the phone number to see if she has better luck trying to get her ultrasound scheduled.

## 2020-07-16 NOTE — Telephone Encounter (Addendum)
I have called the patient to inform her that I will see what to do about the referral or the ultra sound. I have called Vascular Specialist office to see if we can get the pt scheduled. Spoke to [place] and she was going to transfer me to [place] that takes care of it. She was not available and will call me back.   I have informed pt that I will call her back with update today.   I will try to call Vascular specialist back @ [phone]

## 2020-07-16 NOTE — Telephone Encounter (Signed)
Spoke to [place] with vascular to get pt scheduled with them and we spoke to her with the Just , NP on the line to get the clarification that is needed to get pt scheduled.    I have called pt back and informed her that I have spoke with [facility] with Vascular specialist who will call her today to get her scheduled for an ultra sound appointment. I apologized that Gershon Mussel cone work que may be back up and gave her the number to Vascular specialist if she doesn't have a call back near the end of day today.   Pt stated understanding.

## 2020-07-17 ENCOUNTER — Other Ambulatory Visit: Payer: Self-pay

## 2020-07-17 DIAGNOSIS — I824Y9 Acute embolism and thrombosis of unspecified deep veins of unspecified proximal lower extremity: Secondary | ICD-10-CM

## 2020-07-18 ENCOUNTER — Ambulatory Visit (HOSPITAL_COMMUNITY)
Admission: RE | Admit: 2020-07-18 | Discharge: 2020-07-18 | Disposition: A | Discharge status: Home / Self Care | Payer: Self-pay | Source: Ambulatory Visit | Attending: Physician Assistant | Admitting: Physician Assistant

## 2020-07-18 ENCOUNTER — Ambulatory Visit (INDEPENDENT_AMBULATORY_CARE_PROVIDER_SITE_OTHER): Payer: Self-pay | Admitting: Physician Assistant

## 2020-07-18 ENCOUNTER — Other Ambulatory Visit: Payer: Self-pay

## 2020-07-18 VITALS — BP 102/70 | HR 61 | Temp 98.4°F | Resp 20 | Ht 62.0 in | Wt 229.0 lb

## 2020-07-18 DIAGNOSIS — I824Y9 Acute embolism and thrombosis of unspecified deep veins of unspecified proximal lower extremity: Secondary | ICD-10-CM

## 2020-07-18 DIAGNOSIS — R600 Localized edema: Secondary | ICD-10-CM

## 2020-07-18 DIAGNOSIS — M79605 Pain in left leg: Secondary | ICD-10-CM

## 2020-07-18 NOTE — Progress Notes (Signed)
Office Note     CC:  follow up Requesting Provider:  Just, Laurita Quint, FNP  HPI: Lauren Andersen is a 49 y.o. (10-20-1970) female who presents bilateral lower extremity edema for 2 to 3 months.  The patient was initially evaluated by family nurse practitioner on June 25, 2020.  Per those notes, the patient complained of left leg swelling for 2 months and on physical exam was found to have bilateral lower extremity edema.  The plan was to proceed with left lower extremity venous duplex ultrasound to rule out DVT, however this was not performed.  The patient was then referred to our office for work-up. She experiences most of the swelling in her foot and ankle which has been going on for approximately 2 years. More recently, she has had left calf swelling. She endorses some mild lower extremity pain particularly after standing for long hours. She does not describe claudication or rest pain. 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 She does elevate her legs regularly, however she has not had success in finding properly fitting compression stockings.  Non-smoker. Negative history of DVT, DM or hypertension   Past Medical History:  Diagnosis Date  . Fatty liver 2012  . Headache(784.0)   . Hepatic steatosis     Past Surgical History:  Procedure Laterality Date  . bladder mesh  2008  . BLADDER SURGERY    . CESAREAN SECTION     x2  . colon mesh    . RECTAL PROLAPSE REPAIR  2008  . TUBAL LIGATION      Social History   Socioeconomic History  . Marital status: Married    Spouse name: Christy Sartorius  . Number of children: 3  . Years of education: Not on file  . Highest education level: Not on file  Occupational History  . 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 Smoking status: Never Smoker  . Smokeless tobacco: Never Used  Vaping Use  . Vaping Use: Never used  Substance and Sexual Activity  . Alcohol use: No  . Drug  use: No  . Sexual activity: Yes    Partners: Male    Birth control/protection: Surgical  Other Topics Concern  . Not on file  Social History Narrative   Came to the [place] from [place] in 2003.   Husband has a son from another partner, plus the three with the patient.   Social Determinants of Health   Financial Resource Strain: Not on file  Food Insecurity: Not on file  Transportation Needs: Not on file  Physical Activity: Not on file  Stress: Not on file  Social Connections: Not on file  Intimate Partner Violence: Not on file   Family History  Problem Relation Age of Onset  . Thyroid disease Mother   . Mental illness Mother        bipolar disorder, depression  . Breast cancer Cousin   . Cancer Maternal Aunt   . Cancer Maternal Aunt     Current Outpatient Medications  Medication Sig Dispense Refill  . fish oil-omega-3 fatty acids 1000 MG capsule Take 1 g by mouth daily. Reported on 08/15/2015    . Multiple Vitamin (MULTIVITAMIN) tablet Take 1 tablet by mouth daily.       No current facility-administered medications for this visit.    No Known Allergies   REVIEW OF SYSTEMS:   [X]  denotes positive finding, [ ]  denotes  negative finding Cardiac  Comments:  Chest pain or chest pressure:    Shortness of breath upon exertion:    Short of breath when lying flat:    Irregular heart rhythm:        Vascular    Pain in calf, thigh, or hip brought on by ambulation:    Pain in feet at night that wakes you up from your sleep:     Blood clot in your veins:    Leg swelling:  x       Pulmonary    Oxygen at home:    Productive cough:     Wheezing:         Neurologic    Sudden weakness in arms or legs:     Sudden numbness in arms or legs:     Sudden onset of difficulty speaking or slurred speech:    Temporary loss of vision in one eye:     Problems with dizziness:         Gastrointestinal    Blood in stool:     Vomited blood:         Genitourinary    Burning when  urinating:     Blood in urine:        Psychiatric    Major depression:         Hematologic    Bleeding problems:    Problems with blood clotting too easily:        Skin    Rashes or ulcers:        Constitutional    Fever or chills:      PHYSICAL EXAMINATION: Vitals:   07/18/20 0847  BP: 102/70  Pulse: 61  Resp: 20  Temp: 98.4 F (36.9 C)  SpO2: 95%    General:  WDWN in NAD; vital signs documented above Gait: unaided HENT: WNL, normocephalic Pulmonary: normal non-labored breathing , without Rales, rhonchi,  wheezing Cardiac: regular HR, without  Murmurs without carotid bruit Abdomen: soft, NT, no masses Skin: without rashes Vascular Exam/Pulses: 2+ palpable dorsalis pedis, femoral and radial pulses bilaterally. Extremities: without ischemic changes, without Gangrene , without cellulitis; without open wounds; no appreciable edema. Scattered spider veins of both lower extremities. No varicosities Musculoskeletal: no muscle wasting or atrophy  Neurologic: A&O X 3;  No focal weakness or paresthesias are detected Psychiatric:  The pt has Normal affect.   Non-Invasive Vascular Imaging:   Left lower extremity venous DVT study 07/18/2020 RIGHT:  - No evidence of common femoral vein obstruction.    LEFT:  - There is no evidence of deep vein thrombosis in the lower extremity.  - There is no evidence of superficial venous thrombosis.    - No cystic structure found in the popliteal fossa.     ASSESSMENT/PLAN:: 49 y.o. female here for evaluation of lower extremity edema.  No evidence of left lower extremity DVT.   Recommend daily elevation of lower extremities above heart level and she prefers to purchase thigh-high compression stockings today. No restrictions regarding physical exercise.  I recommended returning in 3 months for bilateral lower extremity venous duplex examination. At that time, we can reassess her symptoms and determine whether she may be a candidate  for endovenous ablation. She is given written informational material. She is in agreement with this plan  Barbie Banner, PA-C [facility] [phone]  Clinic MD:   Scot Dock

## 2020-07-20 ENCOUNTER — Other Ambulatory Visit: Payer: Self-pay

## 2020-07-20 DIAGNOSIS — R600 Localized edema: Secondary | ICD-10-CM

## 2020-07-23 ENCOUNTER — Ambulatory Visit (INDEPENDENT_AMBULATORY_CARE_PROVIDER_SITE_OTHER): Payer: Self-pay | Admitting: Family Medicine

## 2020-07-23 ENCOUNTER — Encounter: Payer: Self-pay | Admitting: Family Medicine

## 2020-07-23 ENCOUNTER — Other Ambulatory Visit: Payer: Self-pay

## 2020-07-23 DIAGNOSIS — I739 Peripheral vascular disease, unspecified: Secondary | ICD-10-CM

## 2020-07-23 NOTE — Patient Instructions (Addendum)
Peripheral Vascular Disease  Peripheral vascular disease (PVD) is a disease of the blood vessels that are not part of your heart and brain. A simple term for PVD is poor circulation. In most cases, PVD narrows the blood vessels that carry blood from your heart to the rest of your body. This can reduce the supply of blood to your arms, legs, and internal organs, like your stomach or kidneys. However, PVD most often affects a persons lower legs and feet. Without treatment, PVD tends to get worse. PVD can also lead to acute ischemic limb. This is when an arm or leg suddenly cannot get enough blood. This is a medical emergency. Follow these instructions at home: Lifestyle  Do not use any products that contain nicotine or tobacco, such as cigarettes and e-cigarettes. If you need help quitting, ask your doctor.  Lose weight if you are overweight. Or, stay at a healthy weight as told by your doctor.  Eat a diet that is low in fat and cholesterol. If you need help, ask your doctor.  Exercise regularly. Ask your doctor for activities that are right for you. General instructions  Take over-the-counter and prescription medicines only as told by your doctor.  Take good care of your feet: ? Wear comfortable shoes that fit well. ? Check your feet often for any cuts or sores.  Keep all follow-up visits as told by your doctor This is important. Contact a doctor if:  You have cramps in your legs when you walk.  You have leg pain when you are at rest.  You have coldness in a leg or foot.  Your skin changes.  You are unable to get or have an erection (erectile dysfunction).  You have cuts or sores on your feet that do not heal. Get help right away if:  Your arm or leg turns cold, numb, and blue.  Your arms or legs become red, warm, swollen, painful, or numb.  You have chest pain.  You have trouble breathing.  You suddenly have weakness in your face, arm, or leg.  You become very  confused or you cannot speak.  You suddenly have a very bad headache.  You suddenly cannot see. Summary  Peripheral vascular disease (PVD) is a disease of the blood vessels.  A simple term for PVD is poor circulation. Without treatment, PVD tends to get worse.  Treatment may include exercise, low fat and low cholesterol diet, and quitting smoking. This information is not intended to replace advice given to you by your health care provider. Make sure you discuss any questions you have with your health care provider. Document Revised: 07/03/2017 Document Reviewed: 08/28/2016 Elsevier Patient Education  [employer].   If you have lab work done today you will be contacted with your lab results within the next 2 weeks.  If you have not heard from [place] then please contact us. The fastest way to get your results is to register for My Chart.   IF you received an x-ray today, you will receive an invoice from [facility]. Please contact [facility] at [phone] with questions or concerns regarding your invoice.   IF you received labwork today, you will receive an invoice from [facility]. Please contact [facility] at [phone] with questions or concerns regarding your invoice.   Our billing staff will not be able to assist you with questions regarding bills from these companies.  You will be contacted with the lab results as soon as they are available.  The fastest way to get your results is to activate your My Chart account. Instructions are located on the last page of this paperwork. If you have not heard from [place] regarding the results in 2 weeks, please contact this office.

## 2020-07-23 NOTE — Progress Notes (Signed)
12/20/20213:37 PM  [place] October 17, 1970, 49 y.o., female [id number]  Chief Complaint  Patient presents with  . L leg / foot swelling     Follow up     HPI:   Patient is a 49 y.o. female with past medical history significant for PVD, erosion surgical wall who presents today for Leg swelling f/u.  Left lower extremity venous DVT study 07/18/2020 RIGHT:  - No evidence of common femoral vein obstruction.    LEFT:  - There is no evidence of deep vein thrombosis in the lower extremity.  - There is no evidence of superficial venous thrombosis.    - No cystic structure found in the popliteal fossa.  Vascular 12/15 Recommend daily elevation of lower extremities above heart level and she prefers to purchase thigh-high compression stockings today. No restrictions regarding physical exercise.  I recommended returning in 3 months for bilateral lower extremity venous duplex examination. At that time, we can reassess her symptoms and determine whether she may be a candidate for endovenous ablation.   Doesn't like to take medications if she doesn't need it Doesn't like wearing TEDs  Will be meeting with a doctor in Jan for a ovarian cyst and bladder mesh Period came in May and then November   BP Readings from Last 3 Encounters:  07/23/20 112/74  07/18/20 102/70  06/25/20 112/74     Depression screen PHQ 2/9 07/23/2020 06/25/2020 02/18/2019  Decreased Interest 0 0 0  Down, Depressed, Hopeless 0 0 0  PHQ - 2 Score 0 0 0  Altered sleeping - - -  Tired, decreased energy - - -  Change in appetite - - -  Feeling bad or failure about yourself  - - -  Trouble concentrating - - -  Moving slowly or fidgety/restless - - -  Suicidal thoughts - - -  PHQ-9 Score - - -    Fall Risk  07/23/2020 06/25/2020 02/18/2019 08/22/2014  Falls in the past year? 0 0 0 No  Number falls in past yr: 0 0 0 -  Injury with Fall? 0 0 0 -  Follow up Falls evaluation completed Falls  evaluation completed - -     No Known Allergies  Prior to Admission medications   Medication Sig Start Date End Date Taking? Authorizing Provider  fish oil-omega-3 fatty acids 1000 MG capsule Take 1 g by mouth daily. Reported on 08/15/2015   Yes [provider]  Multiple Vitamin (MULTIVITAMIN) tablet Take 1 tablet by mouth daily.   Yes [provider]    Past Medical History:  Diagnosis Date  . Fatty liver 2012  . Headache(784.0)   . Hepatic steatosis     Past Surgical History:  Procedure Laterality Date  . bladder mesh  2008  . BLADDER SURGERY    . CESAREAN SECTION     x2  . colon mesh    . RECTAL PROLAPSE REPAIR  2008  . TUBAL LIGATION      Social History   Tobacco Use  . Smoking status: Never Smoker  . Smokeless tobacco: Never Used  Substance Use Topics  . Alcohol use: No    Family History  Problem Relation Age of Onset  . Thyroid disease Mother   . Mental illness Mother        bipolar disorder, depression  . Breast cancer Cousin   . Cancer Maternal Aunt   . Cancer Maternal Aunt     Review of Systems  Constitutional: Negative  for chills, fever and malaise/fatigue.  Eyes: Negative for blurred vision and double vision.  Respiratory: Negative for cough, shortness of breath and wheezing.   Cardiovascular: Negative for chest pain, palpitations and leg swelling.  Gastrointestinal: Negative for abdominal pain, blood in stool, constipation, diarrhea, heartburn, nausea and vomiting.  Genitourinary: Negative for dysuria, frequency and hematuria.  Musculoskeletal: Negative for back pain and joint pain.  Skin: Negative for rash.  Neurological: Negative for dizziness, weakness and headaches.     OBJECTIVE:  Today's Vitals   07/23/20 1510  BP: 112/74  Pulse: 66  Temp: 98 F (36.7 C)  SpO2: 99%  Weight: 227 lb (103 kg)  Height: 5\' 2"  (1.575 m)   Body mass index is 41.52 kg/m.   Physical Exam Constitutional:      General: She is not  in acute distress.    Appearance: Normal appearance. She is not ill-appearing.  HENT:     Head: Normocephalic.  Cardiovascular:     Rate and Rhythm: Normal rate and regular rhythm.     Pulses: Normal pulses.     Heart sounds: Normal heart sounds. No murmur heard. No friction rub. No gallop.   Pulmonary:     Effort: Pulmonary effort is normal. No respiratory distress.     Breath sounds: Normal breath sounds. No stridor. No wheezing, rhonchi or rales.  Abdominal:     General: Bowel sounds are normal.     Palpations: Abdomen is soft.     Tenderness: There is no abdominal tenderness.  Musculoskeletal:        General: Tenderness (Left leg) present. No deformity or signs of injury.     Right lower leg: Edema present.     Left lower leg: Edema present.  Skin:    General: Skin is warm and dry.  Neurological:     Mental Status: She is alert and oriented to person, place, and time.  Psychiatric:        Mood and Affect: Mood normal.        Behavior: Behavior normal.     No results found for this or any previous visit (from the past 24 hour(s)).  No results found.   ASSESSMENT and PLAN  Problem List Items Addressed This Visit      Cardiovascular and Mediastinum   PVD (peripheral vascular disease) ([place])  Continue to follow Vasculars req's Wear compression socks daily Elevate legs as able.     Discussed LFM for A1C and Cholesterol Will get labs next visit.   Return in about 4 months (around 11/21/2020).    Huston Foley Suman Trivedi, FNP-BC Primary Care at [address] [place], [place] [address] Ph.  [phone] Fax [phone]

## 2020-10-08 DIAGNOSIS — R102 Pelvic and perineal pain unspecified side: Secondary | ICD-10-CM | POA: Insufficient documentation

## 2020-10-08 DIAGNOSIS — N941 Unspecified dyspareunia: Secondary | ICD-10-CM | POA: Insufficient documentation

## 2020-10-08 DIAGNOSIS — M6289 Other specified disorders of muscle: Secondary | ICD-10-CM | POA: Insufficient documentation

## 2020-10-08 DIAGNOSIS — D219 Benign neoplasm of connective and other soft tissue, unspecified: Secondary | ICD-10-CM | POA: Insufficient documentation

## 2020-10-08 DIAGNOSIS — N9489 Other specified conditions associated with female genital organs and menstrual cycle: Secondary | ICD-10-CM | POA: Insufficient documentation

## 2020-10-17 ENCOUNTER — Ambulatory Visit: Payer: Self-pay | Admitting: Vascular Surgery

## 2020-10-17 ENCOUNTER — Encounter (HOSPITAL_COMMUNITY): Payer: Self-pay

## 2020-10-22 ENCOUNTER — Ambulatory Visit: Payer: Self-pay | Attending: Internal Medicine

## 2020-10-22 DIAGNOSIS — Z23 Encounter for immunization: Secondary | ICD-10-CM

## 2020-11-01 ENCOUNTER — Other Ambulatory Visit: Payer: Self-pay

## 2020-11-01 ENCOUNTER — Ambulatory Visit (INDEPENDENT_AMBULATORY_CARE_PROVIDER_SITE_OTHER): Payer: Self-pay | Admitting: Vascular Surgery

## 2020-11-01 ENCOUNTER — Encounter: Payer: Self-pay | Admitting: Vascular Surgery

## 2020-11-01 ENCOUNTER — Ambulatory Visit (HOSPITAL_COMMUNITY)
Admission: RE | Admit: 2020-11-01 | Discharge: 2020-11-01 | Disposition: A | Discharge status: Home / Self Care | Payer: Self-pay | Source: Ambulatory Visit | Attending: Vascular Surgery | Admitting: Vascular Surgery

## 2020-11-01 VITALS — BP 113/74 | HR 60 | Temp 98.2°F | Resp 20 | Ht 62.0 in | Wt 229.0 lb

## 2020-11-01 DIAGNOSIS — R600 Localized edema: Secondary | ICD-10-CM | POA: Insufficient documentation

## 2020-11-01 DIAGNOSIS — I83812 Varicose veins of left lower extremities with pain: Secondary | ICD-10-CM

## 2020-11-01 NOTE — Progress Notes (Signed)
Referring Physician: Huston Foley Just, FNP  Patient name: Lauren Andersen MRN: [id number] DOB: 30-Jul-1971 Sex: female  REASON FOR CONSULT: Symptomatic varicose veins with pain  HPI: Lauren Andersen is a 50 y.o. female, with a 64-month history of pain and swelling in her left lower extremity.  She was previously seen in our [place] clinic on December 15 and compression stockings were recommended.  The patient has intermittently worn these with some relief.  Additionally she has been evaluated at [facility] for a pelvic mesh and apparently is having some type of operation done for this in the near future.  There is some concern as to whether or not the mesh may be causing her left leg symptoms.  She states overall her symptoms have improved since she was seen in December.  She has no prior history of DVT.  She really has no right leg symptoms.    Past Medical History:  Diagnosis Date  . Fatty liver 2012  . Headache(784.0)   . Hepatic steatosis    Past Surgical History:  Procedure Laterality Date  . bladder mesh  2008  . BLADDER SURGERY    . CESAREAN SECTION     x2  . colon mesh    . RECTAL PROLAPSE REPAIR  2008  . TUBAL LIGATION      Family History  Problem Relation Age of Onset  . Thyroid disease Mother   . Mental illness Mother        bipolar disorder, depression  . Breast cancer Cousin   . Cancer Maternal Aunt   . Cancer Maternal Aunt     SOCIAL HISTORY: Social History   Socioeconomic History  . Marital status: Married    Spouse name: Christy Sartorius  . Number of children: 3  . Years of education: Not on file  . Highest education level: Not on file  Occupational History  . 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 Smoking status: Never Smoker  . Smokeless tobacco: Never Used  Vaping Use  . Vaping Use: Never used  Substance and Sexual Activity  . Alcohol use: No  . Drug use: No  . Sexual activity: Yes    Partners:  Male    Birth control/protection: Surgical  Other Topics Concern  . Not on file  Social History Narrative   Came to the [place] from [place] in 2003.   Husband has a son from another partner, plus the three with the patient.   Social Determinants of Health   Financial Resource Strain: Not on file  Food Insecurity: Not on file  Transportation Needs: Not on file  Physical Activity: Not on file  Stress: Not on file  Social Connections: Not on file  Intimate Partner Violence: Not on file    No Known Allergies  Current Outpatient Medications  Medication Sig Dispense Refill  . diazepam (VALIUM) 5 MG tablet PLEASE SEE ATTACHED FOR DETAILED DIRECTIONS    . fish oil-omega-3 fatty acids 1000 MG capsule Take 1 g by mouth daily. Reported on 08/15/2015    . Multiple Vitamin (MULTIVITAMIN) tablet Take 1 tablet by mouth daily.    Marland Kitchen tiZANidine (ZANAFLEX) 4 MG capsule PLEASE SEE ATTACHED FOR DETAILED DIRECTIONS (Patient not taking: Reported on 11/01/2020)     No current facility-administered medications for this visit.    ROS:   General:  No weight loss, Fever, chills  HEENT: No recent headaches, no nasal bleeding, no visual changes,  no sore throat  Neurologic: No dizziness, blackouts, seizures. No recent symptoms of stroke or mini- stroke. No recent episodes of slurred speech, or temporary blindness.  Cardiac: No recent episodes of chest pain/pressure, no shortness of breath at rest.  No shortness of breath with exertion.  Denies history of atrial fibrillation or irregular heartbeat  Vascular: No history of rest pain in feet.  No history of claudication.  No history of non-healing ulcer, No history of DVT   Pulmonary: No home oxygen, no productive cough, no hemoptysis,  No asthma or wheezing  Musculoskeletal:  [ ]  Arthritis, [ ]  Low back pain,  [ ]  Joint pain  Hematologic:No history of hypercoagulable state.  No history of easy bleeding.  No history of anemia  Gastrointestinal: No  hematochezia or melena,  No gastroesophageal reflux, no trouble swallowing  Urinary: [ ]  chronic Kidney disease, [ ]  on HD - [ ]  MWF or [ ]  TTHS, [ ]  Burning with urination, [ ]  Frequent urination, [ ]  Difficulty urinating;   Skin: No rashes  Psychological: No history of anxiety,  No history of depression   Physical Examination  Vitals:   11/01/20 1351  BP: 113/74  Pulse: 60  Resp: 20  Temp: 98.2 F (36.8 C)  SpO2: 94%  Weight: 229 lb (103.9 kg)  Height: 5\' 2"  (1.575 m)    Body mass index is 41.88 kg/m.  General:  Alert and oriented, no acute distress HEENT: Normal Skin: No rash, few scattered spider type varicosities on the thigh bilaterally Extremity Pulses:  2+ radial, brachial, femoral, dorsalis pedis, posterior tibial pulses bilaterally Musculoskeletal: No deformity or edema  Neurologic: Upper and lower extremity motor 5/5 and symmetric  DATA:  Patient had a venous duplex today for reflux.  This showed reflux at the left saphenofemoral junction with a 4 to 5 mm vein.  She had minimal reflux in the right leg with a 4 mm greater saphenous vein.  I repeated portions of her left leg exam with the SonoSite at the bedside today.  This again shows about a 4 mm vein on the left side.  ASSESSMENT: Patient with edema aching pain left leg potentially secondary to venous reflux in her superficial venous system.  However, the patient is also planned to have an operation for a pelvic mesh in the near future which is also thought to potentially be causing her left leg symptoms.  I discussed with her today the pathophysiology of venous disease and reassured her that this does not put her at risk of limb loss or increased chances of DVT or pulmonary embolus.  She states she has relieved some of the symptoms with compression stockings and is also curious whether or not her symptoms may also get better after her pelvic mesh surgery.  She would like to defer any intervention on her veins until  this is already completed.   PLAN: We will continue to wear her lower extremity compression stockings.  She will follow up with [place] in 6 months time to see if she has had any improvement of her symptoms after her pelvic operation.   Ruta Hinds, MD [facility] [place] Office: [phone]

## 2020-11-21 ENCOUNTER — Ambulatory Visit: Payer: Self-pay | Admitting: Family Medicine

## 2021-05-12 IMAGING — MG MM DIGITAL DIAGNOSTIC UNILAT*R* W/ TOMO W/ CAD
6 series · 6 of 18 positions shown · non-contrast
Comparison: Previous exam(s).

CLINICAL DATA: Screening recall for a possible right breast mass.

EXAM:
DIGITAL DIAGNOSTIC RIGHT MAMMOGRAM WITH CAD AND TOMO
ULTRASOUND RIGHT BREAST

[R MLO synth-2D]
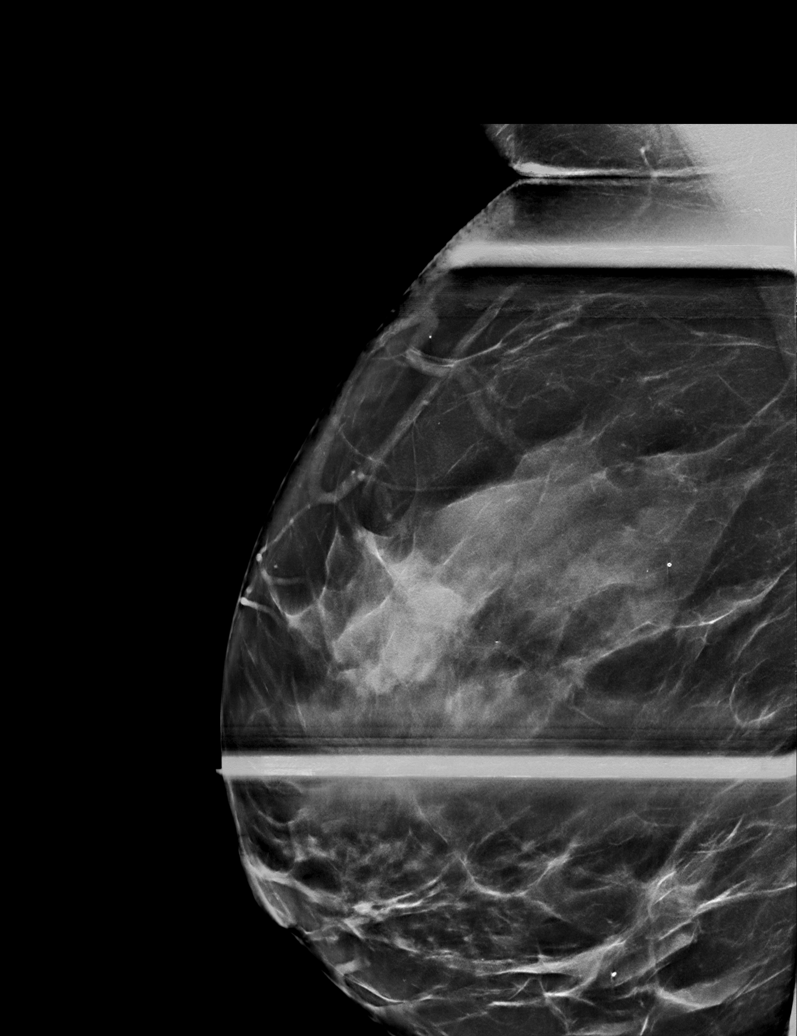

[R CC synth-2D]
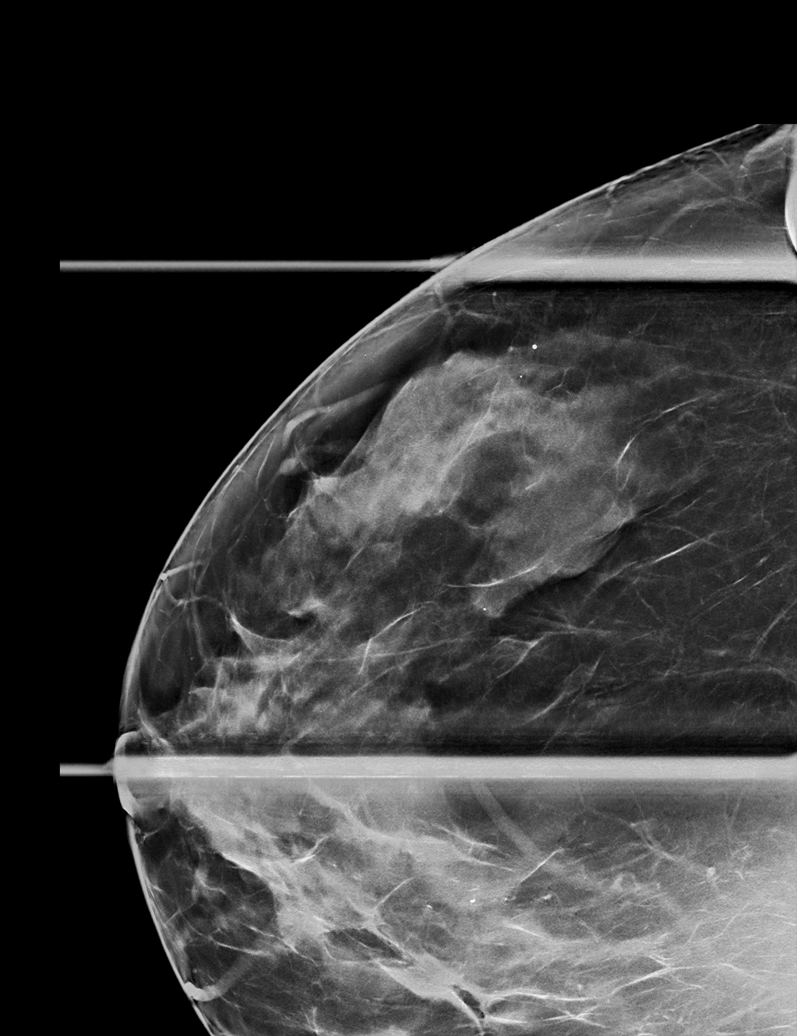

[R ML synth-2D]
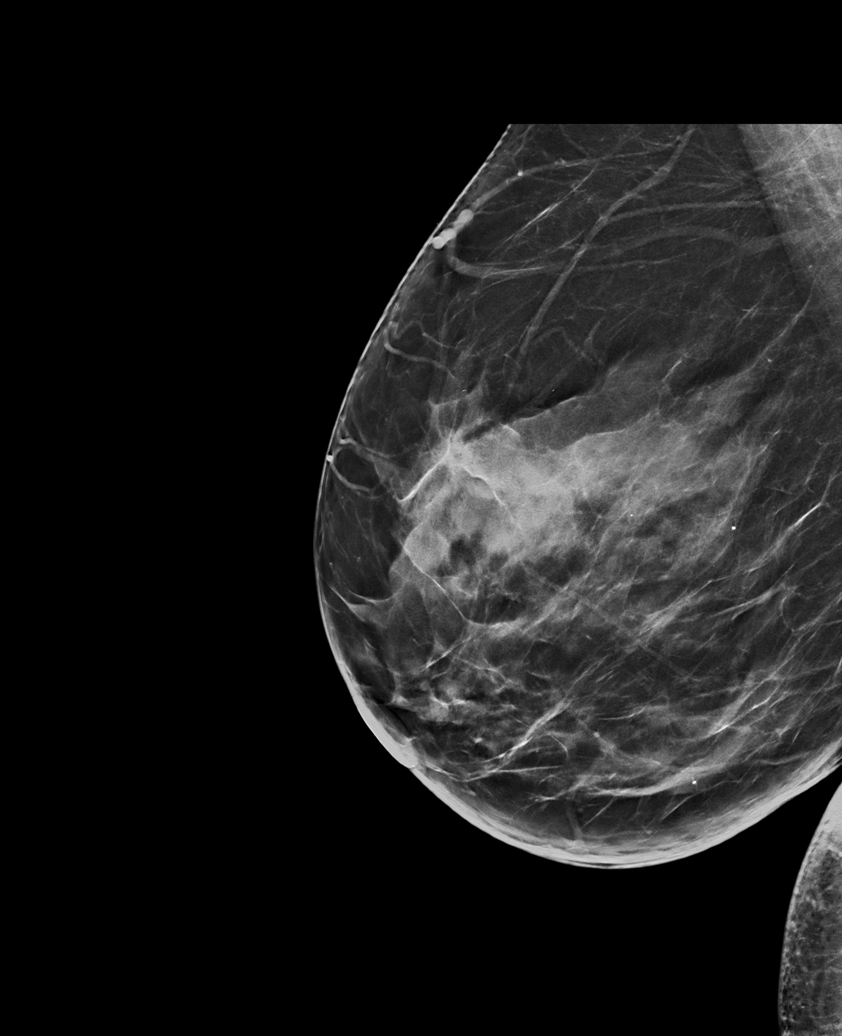

[R CC tomo · tomo slice 37/73.0]
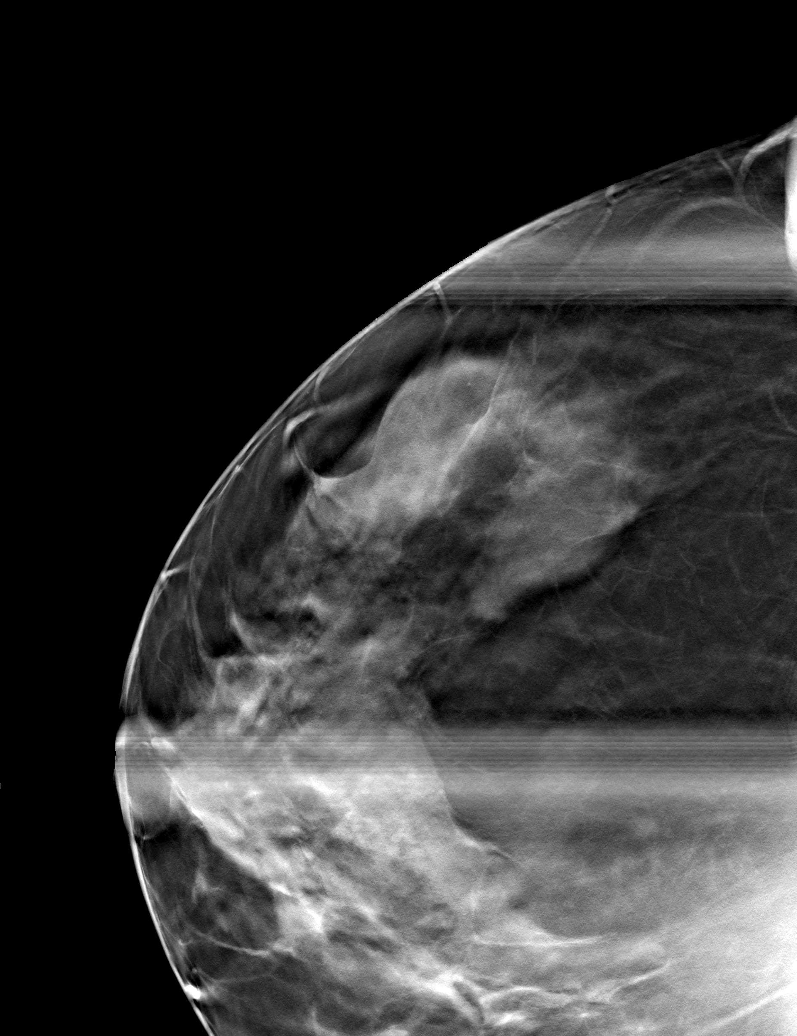

[R MLO tomo · tomo slice 45/88.0]
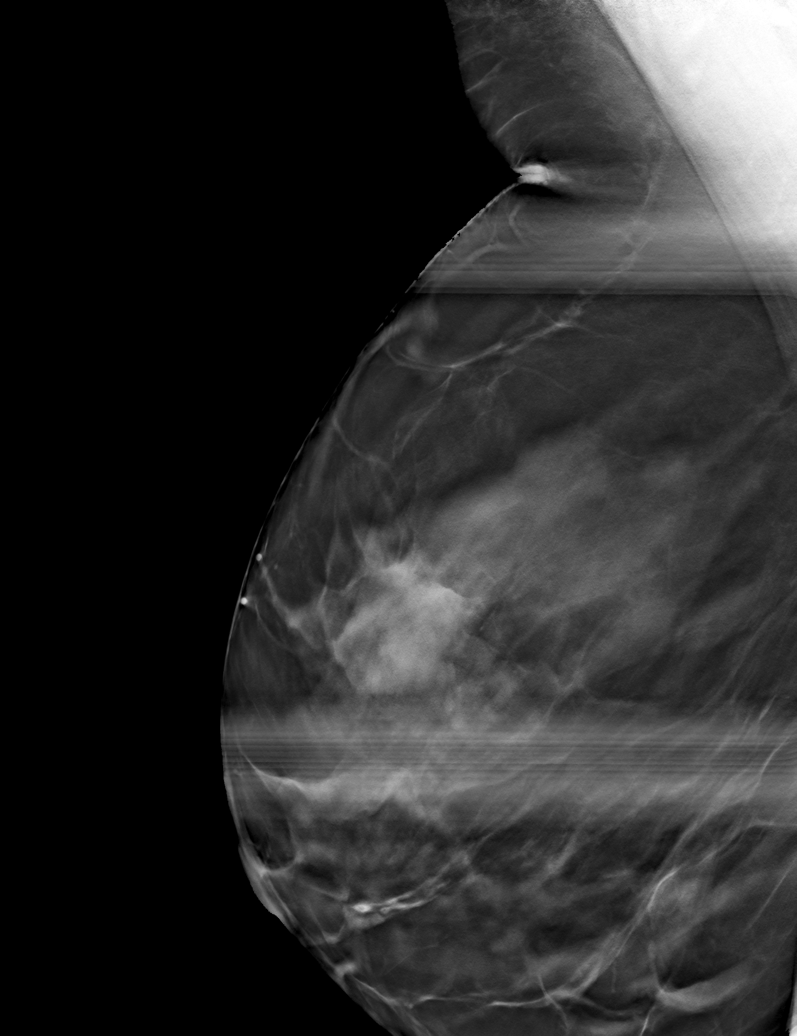

[R ML tomo · tomo slice 39/78.0]
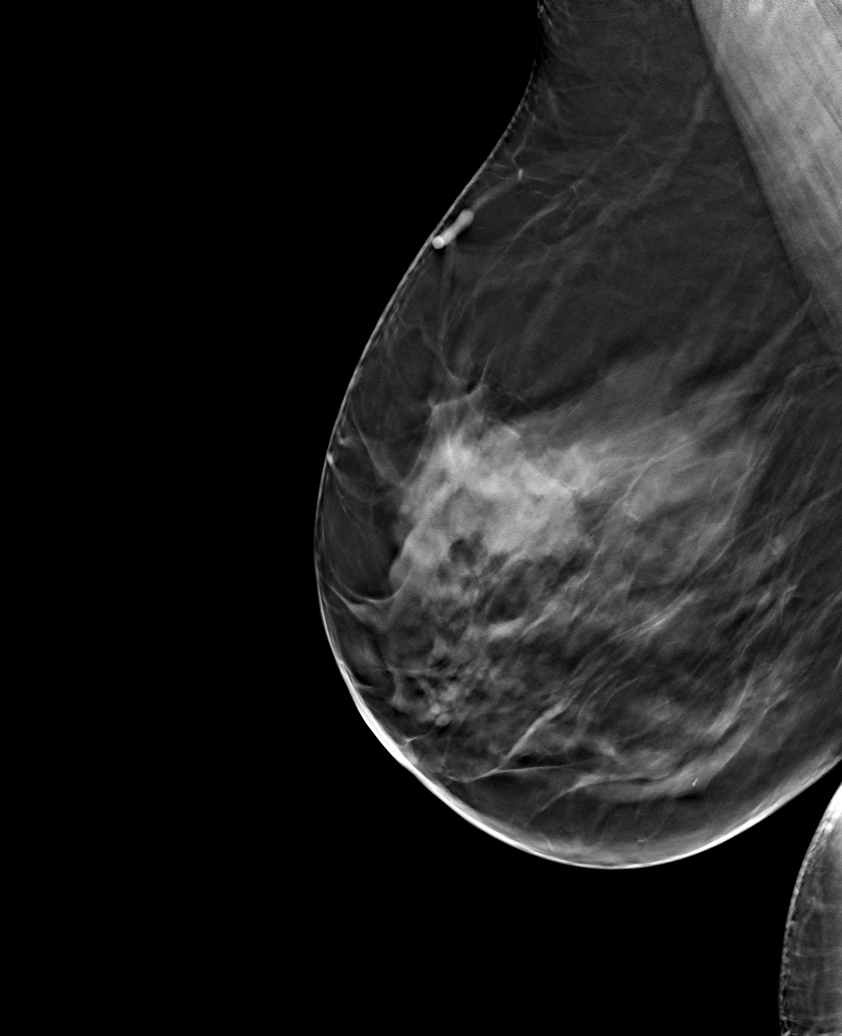

[6 of 18 positions shown; findings below may reference images not displayed]

ACR Breast Density Category c: The breast tissue is heterogeneously
dense, which may obscure small masses.
FINDINGS: Spot compression tomosynthesis images through the slightly upper
outer quadrant of the right breast demonstrates a probable obscured
oval mass in the posterior depth.

Mammographic images were processed with CAD.

Ultrasound targeted to the right breast at 10 o'clock, 6 cm from the
nipple demonstrates 2 adjacent anechoic oval circumscribed masses
together measuring 2.6 x 0.9 x 2.1 cm.
IMPRESSION: The mass in the lateral right breast corresponds with a benign cyst.

RECOMMENDATION:
Screening mammogram in one year.(Code:QZ-[id number])

I have discussed the findings and recommendations with the patient.
If applicable, a reminder letter will be sent to the patient
regarding the next appointment.

BI-RADS CATEGORY  2: Benign.

## 2022-01-29 ENCOUNTER — Ambulatory Visit (INDEPENDENT_AMBULATORY_CARE_PROVIDER_SITE_OTHER): Payer: Self-pay | Admitting: Family Medicine

## 2022-01-29 ENCOUNTER — Encounter: Payer: Self-pay | Admitting: Family Medicine

## 2022-01-29 VITALS — BP 130/82 | HR 80 | Temp 97.8°F | Ht 62.0 in | Wt 239.0 lb

## 2022-01-29 DIAGNOSIS — D219 Benign neoplasm of connective and other soft tissue, unspecified: Secondary | ICD-10-CM

## 2022-01-29 DIAGNOSIS — Z87898 Personal history of other specified conditions: Secondary | ICD-10-CM | POA: Insufficient documentation

## 2022-01-29 DIAGNOSIS — Z872 Personal history of diseases of the skin and subcutaneous tissue: Secondary | ICD-10-CM | POA: Insufficient documentation

## 2022-01-29 DIAGNOSIS — K760 Fatty (change of) liver, not elsewhere classified: Secondary | ICD-10-CM

## 2022-01-29 DIAGNOSIS — Z1211 Encounter for screening for malignant neoplasm of colon: Secondary | ICD-10-CM | POA: Insufficient documentation

## 2022-01-29 DIAGNOSIS — Z1239 Encounter for other screening for malignant neoplasm of breast: Secondary | ICD-10-CM

## 2022-01-29 DIAGNOSIS — R1011 Right upper quadrant pain: Secondary | ICD-10-CM | POA: Insufficient documentation

## 2022-01-29 LAB — COMPREHENSIVE METABOLIC PANEL
ALT: 25 U/L (ref 0–35)
AST: 20 U/L (ref 0–37)
Albumin: 4.3 g/dL (ref 3.5–5.2)
Alkaline Phosphatase: 42 U/L (ref 39–117)
BUN: 16 mg/dL (ref 6–23)
CO2: 28 mEq/L (ref 19–32)
Calcium: 9.7 mg/dL (ref 8.4–10.5)
Chloride: 102 mEq/L (ref 96–112)
Creatinine, Ser: 0.6 mg/dL (ref 0.40–1.20)
GFR: 104.48 mL/min (ref >60.00)
Glucose, Bld: 97 mg/dL (ref 70–99)
Potassium: 3.7 mEq/L (ref 3.5–5.1)
Sodium: 138 mEq/L (ref 135–145)
Total Bilirubin: 0.4 mg/dL (ref 0.2–1.2)
Total Protein: 7.7 g/dL (ref 6.0–8.3)

## 2022-01-29 LAB — TSH: TSH: 2.02 u[IU]/mL (ref 0.35–5.50)

## 2022-01-29 LAB — CBC WITH DIFFERENTIAL/PLATELET
Basophils Absolute: 0.1 10*3/uL (ref 0.0–0.1)
Basophils Relative: 0.8 % (ref 0.0–3.0)
Eosinophils Absolute: 0.4 10*3/uL (ref 0.0–0.7)
Eosinophils Relative: 5.5 % — ABNORMAL HIGH (ref 0.0–5.0)
HCT: 39.7 % (ref 36.0–46.0)
Hemoglobin: 12.8 g/dL (ref 12.0–15.0)
Lymphocytes Relative: 32.8 % (ref 12.0–46.0)
Lymphs Abs: 2.6 10*3/uL (ref 0.7–4.0)
MCHC: 32.4 g/dL (ref 30.0–36.0)
MCV: 89.8 fl (ref 78.0–100.0)
Monocytes Absolute: 0.6 10*3/uL (ref 0.1–1.0)
Monocytes Relative: 7.9 % (ref 3.0–12.0)
Neutro Abs: 4.2 10*3/uL (ref 1.4–7.7)
Neutrophils Relative %: 53 % (ref 43.0–77.0)
Platelets: 233 10*3/uL (ref 150.0–400.0)
RBC: 4.42 Mil/uL (ref 3.87–5.11)
RDW: 13.9 % (ref 11.5–15.5)
WBC: 7.9 10*3/uL (ref 4.0–10.5)

## 2022-01-29 LAB — T4, FREE: Free T4: 1.04 ng/dL (ref 0.60–1.60)

## 2022-01-29 LAB — LIPASE: Lipase: 28 U/L (ref 11.0–59.0)

## 2022-01-29 NOTE — Assessment & Plan Note (Signed)
Overdue for colon cancer screening. Referral to GI.

## 2022-01-29 NOTE — Patient Instructions (Signed)
Go to the lab on the first floor before you leave today.  I have ordered an abdominal ultrasound and you will hear from [facility] imaging to schedule this.  You may call and schedule your mammogram at the breast center.  You will hear from [facility] gastroenterology to schedule a visit regarding your screening colonoscopy and abdominal pain and fatty liver disease.  We will be in touch with your lab results.

## 2022-01-29 NOTE — Assessment & Plan Note (Signed)
Recommend healthy diet changes, increasing exercise.

## 2022-01-29 NOTE — Assessment & Plan Note (Signed)
CMP, RUQ [place] ordered. Referral to GI

## 2022-01-29 NOTE — Assessment & Plan Note (Addendum)
Tenderness to palpation without rebound. CBC, CMP, lipase, RUQ [place] ordered. Denies post prandial pain. Consider GERD.

## 2022-01-29 NOTE — Assessment & Plan Note (Signed)
Encouraged f/u with OB/GYN

## 2022-01-29 NOTE — Progress Notes (Signed)
New Patient Office Visit  Subjective    Patient ID: Lauren Andersen, female    DOB: Dec 19, 1970  Age: 51 y.o. MRN: [id number]  CC:  Chief Complaint  Patient presents with   Establish Care    2009/2010 was told she has fatty liver, has gotten a lot better with diet. Since last year she has started to have pain in her abdominal upper right quadrant, would like dr to check it out.     HPI Lauren Andersen [facility] presents to establish care.  A medical translator is with her today.  Complains of intermittent RUQ pain x 2 months.  Symptoms occur approximately every 2 weeks.  Denies postprandial pain. Hx of fatty liver on [place] in 2012.  No fever, chills, chest pain, palpitations, shortness of breath, nausea, vomiting, diarrhea or constipation.  She does have occasional bloating.  Reports having significant weight gain.   No period in 16 months. Hot flashes, irritable and intermittent headaches.  She has an OB/GYN in [place] and was advised to follow-up with her regarding large fibroid tumors and menopausal symptoms.   Reports history of benign cyst in breast.  States she is overdue for mammogram.  She denies ever having colon cancer screening.    Outpatient Encounter Medications as of 01/29/2022  Medication Sig   fish oil-omega-3 fatty acids 1000 MG capsule Take 1 g by mouth daily. Reported on 08/15/2015   Multiple Vitamin (MULTIVITAMIN) tablet Take 1 tablet by mouth daily.   [DISCONTINUED] diazepam (VALIUM) 5 MG tablet PLEASE SEE ATTACHED FOR DETAILED DIRECTIONS   [DISCONTINUED] tiZANidine (ZANAFLEX) 4 MG capsule PLEASE SEE ATTACHED FOR DETAILED DIRECTIONS (Patient not taking: Reported on 11/01/2020)   No facility-administered encounter medications on file as of 01/29/2022.    Past Medical History:  Diagnosis Date   Fatty liver 2012   Headache(784.0)    Hepatic steatosis     Past Surgical History:  Procedure Laterality Date   bladder mesh  2008   BLADDER  SURGERY     CESAREAN SECTION     x2   colon mesh     RECTAL PROLAPSE REPAIR  2008   TUBAL LIGATION      Family History  Problem Relation Age of Onset   Thyroid disease Mother    Mental illness Mother        bipolar disorder, depression   Breast cancer Cousin    Cancer Maternal Aunt    Cancer Maternal Aunt     Social History   Socioeconomic History   Marital status: Married    Spouse name: Christy Sartorius   Number of children: 3   Years of education: Not on file   Highest education level: Not on file  Occupational History   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   Husband has a son from another partner, plus the three with the patient.   Social Determinants of Health   Financial Resource Strain: Not on file  Food Insecurity: Not on file  Transportation Needs: Not on file  Physical  Activity: Not on file  Stress: Not on file  Social Connections: Not on file  Intimate Partner Violence: Not on file    ROS Pertinent positives and negatives in the history of present illness.      Objective    BP 130/82 (BP Location: Left Arm, Patient Position: Sitting, Cuff Size: Large)   Pulse 80   Temp 97.8 F (36.6 C) (Temporal)   Ht '5\' 2"'$  (1.575 m)   Wt 239 lb (108.4 kg)   SpO2 96%   BMI 43.71 kg/m   Physical Exam Constitutional:      General: She is not in acute distress.    Appearance: She is not ill-appearing.  HENT:     Mouth/Throat:     Mouth: Mucous membranes are moist.  Eyes:     Conjunctiva/sclera: Conjunctivae normal.     Pupils: Pupils are equal, round, and reactive to light.  Cardiovascular:      Rate and Rhythm: Normal rate and regular rhythm.  Pulmonary:     Effort: Pulmonary effort is normal.     Breath sounds: Normal breath sounds.  Abdominal:     General: Bowel sounds are normal. There is no distension.     Palpations: Abdomen is soft.     Tenderness: There is abdominal tenderness in the right upper quadrant. There is no guarding or rebound. Negative signs include Murphy's sign and McBurney's sign.  Musculoskeletal:     Right lower leg: No edema.     Left lower leg: No edema.  Lymphadenopathy:     Cervical: No cervical adenopathy.  Neurological:     Mental Status: She is alert and oriented to person, place, and time.     Cranial Nerves: Cranial nerves 2-12 are intact.     Sensory: Sensation is intact.     Motor: Motor function is intact.  Psychiatric:        Attention and Perception: Attention normal.        Mood and Affect: Mood normal.        Speech: Speech normal.        Behavior: Behavior normal.        Thought Content: Thought content normal.         Assessment & Plan:   Problem List Items Addressed This Visit       Digestive   Hepatic steatosis    CMP, RUQ [place] ordered. Referral to GI      Relevant Orders   Comprehensive metabolic panel   Ambulatory referral to Gastroenterology   US Abdomen Limited RUQ (LIVER/GB)     Endocrine   History of prediabetes    Hgb A1c ordered. Recommend weight loss      Relevant Orders   Hemoglobin A1c     Other   Abdominal pain, RUQ - Primary    Tenderness to palpation without rebound. CBC, CMP, lipase, RUQ [place] ordered. Denies post prandial pain. Consider GERD.       Relevant Orders   CBC with Differential/Platelet   Comprehensive metabolic panel   Lipase   Ambulatory referral to Gastroenterology   US Abdomen Limited RUQ (LIVER/GB)   Fibroid    Encouraged f/u with OB/GYN      History of cyst of breast    Overdue for mammogram. Mammogram ordered. [facility].        Relevant Orders   MM 3D SCREEN BREAST BILATERAL   Obesity, Class III, BMI 40-49.9 (morbid obesity) ([place])  Recommend healthy diet changes, increasing exercise.       Relevant Orders   TSH   T4, free   Screen for colon cancer    Overdue for colon cancer screening. Referral to GI.       Relevant Orders   Ambulatory referral to Gastroenterology   Other Visit Diagnoses     Encounter for screening for malignant neoplasm of breast, unspecified screening modality       Relevant Orders   MM 3D SCREEN BREAST BILATERAL       Return for Pending results from labs and ultrasound.   Harland Dingwall, NP-C

## 2022-01-29 NOTE — Assessment & Plan Note (Signed)
Overdue for mammogram. Mammogram ordered. [facility] to schedule.

## 2022-01-29 NOTE — Assessment & Plan Note (Signed)
Hgb A1c ordered. Recommend weight loss

## 2022-01-30 LAB — HEMOGLOBIN A1C: Hgb A1c MFr Bld: 6.6 % — ABNORMAL HIGH (ref 4.6–6.5)

## 2022-01-30 NOTE — Progress Notes (Signed)
Her blood tests show that she now has diabetes. She is very close to the border between prediabetes and diabetes. I recommend that she cut back on sugar, carbohydrates, and junk food and increase her physical activity. Specifically limit potatoes, rice, bread, pasta.  We should discuss this further and I am ok giving her 3-4 months to try and get her blood sugar down by making healthy diet changes if she prefers. If she prefers to start on medication then schedule an ov please.

## 2022-02-05 ENCOUNTER — Telehealth: Payer: Self-pay | Admitting: Family Medicine

## 2022-02-05 NOTE — Telephone Encounter (Signed)
ATC pt to go over lab results but pt did not pick up. If pt calls back please read off lab results from 6/28

## 2022-02-05 NOTE — Telephone Encounter (Signed)
Pt called in to go over test results. She is requesting a callback.   Please advise  CB: [phone]

## 2022-02-06 ENCOUNTER — Ambulatory Visit
Admission: RE | Admit: 2022-02-06 | Discharge: 2022-02-06 | Disposition: A | Discharge status: Home / Self Care | Payer: Self-pay | Source: Ambulatory Visit | Attending: Family Medicine | Admitting: Family Medicine

## 2022-02-06 DIAGNOSIS — R1011 Right upper quadrant pain: Secondary | ICD-10-CM

## 2022-02-06 DIAGNOSIS — K760 Fatty (change of) liver, not elsewhere classified: Secondary | ICD-10-CM

## 2022-02-06 NOTE — Progress Notes (Signed)
Her abdominal ultrasound shows fatty liver disease and recommended further imaging with a CT scan. I will order this unless she prefers to see GI first for RUQ pain. Is she still having pain? Please check to see if she has scheduled with GI or where we are in the referral process. Thanks.

## 2022-02-10 ENCOUNTER — Other Ambulatory Visit: Payer: Self-pay | Admitting: Family Medicine

## 2022-02-10 DIAGNOSIS — R1010 Upper abdominal pain, unspecified: Secondary | ICD-10-CM

## 2022-02-10 DIAGNOSIS — K760 Fatty (change of) liver, not elsewhere classified: Secondary | ICD-10-CM

## 2022-02-10 DIAGNOSIS — R932 Abnormal findings on diagnostic imaging of liver and biliary tract: Secondary | ICD-10-CM

## 2022-03-11 ENCOUNTER — Ambulatory Visit
Admission: RE | Admit: 2022-03-11 | Discharge: 2022-03-11 | Disposition: A | Discharge status: Home / Self Care | Payer: Self-pay | Source: Ambulatory Visit | Attending: Family Medicine | Admitting: Family Medicine

## 2022-03-11 DIAGNOSIS — R932 Abnormal findings on diagnostic imaging of liver and biliary tract: Secondary | ICD-10-CM

## 2022-03-11 DIAGNOSIS — R1010 Upper abdominal pain, unspecified: Secondary | ICD-10-CM

## 2022-03-11 DIAGNOSIS — K760 Fatty (change of) liver, not elsewhere classified: Secondary | ICD-10-CM

## 2022-03-11 MED ORDER — IOPAMIDOL (ISOVUE-370) INJECTION 76%
80.0000 mL | Freq: Once | INTRAVENOUS | Status: AC | PRN
  Administered 2022-03-11: 80 mL via INTRAVENOUS

## 2022-03-11 NOTE — Progress Notes (Signed)
Her CT showed fatty liver disease and a 5 mm cyst or hemangioma in the liver. I referred her to GI in June but it does not look like she has an appointment with them. Please check on this.  Also, she has an enlarged uterus suggesting fibroids. A pelvic US is recommended. Has she seen her OB/GYN yet? They can manage this for her. Let me know. Thanks.

## 2022-03-24 ENCOUNTER — Encounter: Payer: Self-pay | Admitting: Nurse Practitioner

## 2022-03-24 ENCOUNTER — Telehealth: Payer: Self-pay | Admitting: Family Medicine

## 2022-03-24 NOTE — Telephone Encounter (Signed)
Called pt with spanish interpreter Donnie Aho and informed her that she will need to contact [facility] to schedule and discuss colonoscopy. All questions pt had were answered and [facility] number was provided. Pt will call to schedule.

## 2022-03-24 NOTE — Telephone Encounter (Signed)
Patient would like to know if she still needs her colonoscopy?  Please advise.

## 2022-03-26 ENCOUNTER — Ambulatory Visit (INDEPENDENT_AMBULATORY_CARE_PROVIDER_SITE_OTHER): Payer: Self-pay

## 2022-03-26 ENCOUNTER — Ambulatory Visit: Payer: Self-pay | Admitting: Emergency Medicine

## 2022-03-26 ENCOUNTER — Encounter: Payer: Self-pay | Admitting: Emergency Medicine

## 2022-03-26 ENCOUNTER — Ambulatory Visit (INDEPENDENT_AMBULATORY_CARE_PROVIDER_SITE_OTHER): Payer: Self-pay | Admitting: Emergency Medicine

## 2022-03-26 VITALS — BP 132/80 | HR 72 | Temp 98.5°F | Ht 62.0 in | Wt 228.5 lb

## 2022-03-26 DIAGNOSIS — G8929 Other chronic pain: Secondary | ICD-10-CM | POA: Insufficient documentation

## 2022-03-26 DIAGNOSIS — M25561 Pain in right knee: Secondary | ICD-10-CM

## 2022-03-26 MED ORDER — MELOXICAM 15 MG PO TABS: 15.0000 mg | ORAL_TABLET | Freq: Every day | ORAL | 0 refills | Status: DC

## 2022-03-26 NOTE — Assessment & Plan Note (Addendum)
Related to recently increased physical activity. Differential diagnosis discussed. Stable examination. X-ray done today.  Significant findings of osteoarthrosis. Recommend orthopedic/sports medicine evaluation.  Referral placed today. Meloxicam 15 mg daily for pain.

## 2022-03-26 NOTE — Patient Instructions (Signed)
Dolor agudo de [employer] adultos Acute Knee Pain, Adult El dolor de rodilla puede tener muchas causas. A veces, el dolor de rodilla es repentino (agudo) y puede deberse a dao, hinchazn o irritacin de los msculos y tejidos que sostienen la rodilla. A menudo, el dolor desaparece solo con el tiempo y con reposo. Si el dolor no desaparece, pueden hacerse pruebas para hallar su causa. Siga estas instrucciones en su casa: Si tiene una rodillera o un dispositivo ortopdico:  Use la rodillera o el dispositivo ortopdico como se lo haya indicado el mdico. Quteselos solamente como se lo haya indicado el mdico. Afljeselos si los dedos del pie: Hormiguean. Se adormecen. Se tornan fros y de [employer]. Mantngalos limpios. Si la rodillera o el dispositivo ortopdico no son impermeables: No deje que se mojen. Cbralos con un envoltorio hermtico cuando tome un bao de inmersin o una ducha. Actividad Descanse la rodilla. No haga cosas que le causen dolor o que lo intensifiquen. Evite las actividades en las que ambos pies no estn en contacto con el piso al mismo tiempo (actividades de alto impacto). Algunos ejemplos son correr, Art therapist soga y hacer saltos de tijera. Trabaje con un fisioterapeuta para crear un programa de ejercicios seguros, como le haya indicado el mdico. Control del dolor, la rigidez y la hinchazn  Si se lo indican, aplique hielo sobre la rodilla. Para hacer esto: Si tiene una rodillera o un dispositivo ortopdico que se puede quitar, proceda como se lo haya indicado el mdico. Ponga el hielo en una bolsa plstica. Coloque una toalla entre la piel y Therapist, nutritional. Aplique el hielo durante 20 minutos, 2 o 3 veces por da. Retire el hielo si la piel se le pone de color rojo brillante. Esto es [employer]. Si no puede sentir dolor, calor o fro, tiene un mayor riesgo de que se dae la zona. Si se lo indican, use una venda elstica para ejercer presin (compresin) en la  rodilla lesionada. Cuando est sentado o acostado, eleve la rodilla por encima del nivel del corazn. Duerma con una almohada debajo de la rodilla. Indicaciones generales Use los medicamentos de venta libre y los recetados solamente como se lo haya indicado el mdico. No fume ni consuma ningn producto que contenga nicotina o tabaco. Si necesita ayuda para dejar de consumir estos productos, consulte al mdico. Si tiene sobrepeso, trabaje con su mdico y un experto en alimentos (nutricionista) para establecer metas para bajar de peso. El sobrepeso puede aumentar el dolor de rodilla. Controle si hay algn cambio en sus sntomas. Cumpla con todas las visitas de seguimiento. Comunquese con un mdico si: El dolor de rodilla no desaparece. El dolor de rodilla cambia o [place]. Tiene fiebre junto con dolor de rodilla. La rodilla est enrojecida o se siente caliente al tacto. La rodilla le falla o se le queda trabada. Solicite ayuda de inmediato si: La rodilla se le hincha, y la Counselling psychologist. No puede mover la rodilla. Tiene un dolor muy intenso en la rodilla que no se alivia con analgsicos. Resumen El dolor de rodilla puede tener muchas causas. A menudo, el dolor desaparece solo con el tiempo y con reposo. El mdico puede hacerle estudios para Neurosurgeon la causa del dolor. Controle si hay algn cambio en sus sntomas. [employer] dolor con descanso, medicamentos, actividad de poca intensidad y [place] de hielo. Solicite ayuda de inmediato si no puede mover la rodilla o si el dolor de rodilla es muy intenso. Esta  informacin no tiene Marine scientist el consejo del mdico. Asegrese de hacerle al mdico cualquier pregunta que tenga. Document Revised: 02/21/2020 Document Reviewed: 02/21/2020 Elsevier Patient Education  [place].

## 2022-03-26 NOTE — Progress Notes (Signed)
Lauren Andersen POPAYAN 51 y.o.   Chief Complaint  Patient presents with   Knee Pain    Left knee pain x 1 -2 months, constant pain     HISTORY OF PRESENT ILLNESS: This is a 51 y.o. female complaining of constant sharp pain to right knee for the past 7 to 8 weeks. Denies injury but was trying to exercise more walk more than usual. No other associated symptoms. No other complaints or medical concerns today.  Knee Pain    Prior to Admission medications   Medication Sig Start Date End Date Taking? Authorizing Provider  fish oil-omega-3 fatty acids 1000 MG capsule Take 1 g by mouth daily. Reported on 08/15/2015   Yes [provider]  meloxicam (MOBIC) 15 MG tablet Take 1 tablet (15 mg total) by mouth daily. 03/26/22  Yes Karma Hiney, Ines Bloomer, MD  Multiple Vitamin (MULTIVITAMIN) tablet Take 1 tablet by mouth daily.   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 ([place]) 08/22/2014   Hepatic steatosis 08/22/2014    Past Medical History:  Diagnosis Date   Fatty liver 2012   Headache(784.0)    Hepatic steatosis     Past Surgical History:  Procedure Laterality Date   bladder mesh  2008   BLADDER SURGERY     CESAREAN SECTION     x2   colon mesh     RECTAL PROLAPSE REPAIR  2008   TUBAL LIGATION      Social History   Socioeconomic History   Marital status: Married     Spouse name: Christy Sartorius   Number of children: 3   Years of education: Not on file   Highest education level: Not on file  Occupational History   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   Husband has a son from another partner, plus the three with the patient.   Social Determinants of Health   Financial Resource Strain: Not on file  Food Insecurity: Not on file  Transportation Needs: Not on file  Physical Activity: Not on file  Stress: Not on file  Social Connections: Not on file  Intimate Partner Violence: Not on file    Family History  Problem Relation Age of Onset   Thyroid disease Mother    Mental illness Mother        bipolar disorder, depression   Breast cancer Cousin    Cancer Maternal Aunt  Cancer Maternal Aunt      Review of Systems  Constitutional: Negative.  Negative for chills and fever.  HENT: Negative.  Negative for congestion and sore throat.   Respiratory: Negative.  Negative for cough and shortness of breath.   Cardiovascular: Negative.  Negative for chest pain and palpitations.  Gastrointestinal:  Negative for abdominal pain, nausea and vomiting.  Musculoskeletal:  Positive for joint pain (Right knee pain).  Skin: Negative.  Negative for rash.  Neurological:  Negative for dizziness and headaches.  All other systems reviewed and are negative.  Today's Vitals   03/26/22 1424  BP: 132/80  Pulse: 72  Temp: 98.5 F (36.9 C)  TempSrc: Oral  SpO2: 97%  Weight: 228 lb 8 oz (103.6 kg)  Height: '5\' 2"'$  (1.575 m)   Body mass index is 41.79 kg/m.  Physical Exam Vitals reviewed.   Constitutional:      Appearance: Normal appearance.  HENT:     Head: Normocephalic.  Eyes:     Extraocular Movements: Extraocular movements intact.  Cardiovascular:     Rate and Rhythm: Normal rate.  Pulmonary:     Effort: Pulmonary effort is normal.  Musculoskeletal:     Comments: Right knee: No erythema or ecchymosis.  Full range of motion. Mild medial tenderness.  Stable in extension and flexion.  No crepitation.  Skin:    General: Skin is warm and dry.  Neurological:     General: No focal deficit present.     Mental Status: She is alert and oriented to person, place, and time.  Psychiatric:        Mood and Affect: Mood normal.        Behavior: Behavior normal.   DG Knee Complete 4 Views Right  Result Date: 03/26/2022 CLINICAL DATA:  Right knee pain for 2 months. EXAM: RIGHT KNEE - COMPLETE 4+ VIEW COMPARISON:  None Available. FINDINGS: No acute fracture, dislocation, or knee joint effusion is identified. Moderate medial and mild lateral compartment joint space narrowing is present, and there is very mild tricompartmental marginal osteophytosis. A 4 mm calcification is noted in the soft tissues adjacent to the medial condyle of the distal femur. IMPRESSION: Tricompartmental osteoarthrosis, greatest in the medial compartment. Electronically Signed   By: Logan Bores M.D.   On: 03/26/2022 15:08    ASSESSMENT & PLAN: Problem List Items Addressed This Visit       Other   Acute pain of right knee - Primary    Related to recently increased physical activity. Differential diagnosis discussed. Stable examination. X-ray done today.  Significant findings of osteoarthrosis. Recommend orthopedic/sports medicine evaluation.  Referral placed today. Meloxicam 15 mg daily for pain.      Relevant Medications   meloxicam (MOBIC) 15 MG tablet   Other Relevant Orders   Ambulatory referral to Sports Medicine   DG Knee Complete 4 Views Right (Completed)   Patient Instructions  Dolor  agudo de rodilla en los adultos Acute Knee Pain, Adult El dolor de rodilla puede tener muchas causas. A veces, el dolor de rodilla es repentino (agudo) y puede deberse a dao, hinchazn o irritacin de los msculos y tejidos que sostienen la rodilla. A menudo, el dolor desaparece solo con el tiempo y con reposo. Si el dolor no desaparece, pueden hacerse pruebas para hallar su causa. Siga estas instrucciones en su casa: Si tiene una rodillera o un dispositivo ortopdico:  Use la rodillera o el dispositivo ortopdico como se lo haya indicado el mdico. Quteselos solamente  como se lo haya indicado el mdico. Afljeselos si los dedos del pie: Hormiguean. Se adormecen. Se tornan fros y de [employer]. Mantngalos limpios. Si la rodillera o el dispositivo ortopdico no son impermeables: No deje que se mojen. Cbralos con un envoltorio hermtico cuando tome un bao de inmersin o una ducha. Actividad Descanse la rodilla. No haga cosas que le causen dolor o que lo intensifiquen. Evite las actividades en las que ambos pies no estn en contacto con el piso al mismo tiempo (actividades de alto impacto). Algunos ejemplos son correr, Art therapist soga y hacer saltos de tijera. Trabaje con un fisioterapeuta para crear un programa de ejercicios seguros, como le haya indicado el mdico. Control del dolor, la rigidez y la hinchazn  Si se lo indican, aplique hielo sobre la rodilla. Para hacer esto: Si tiene una rodillera o un dispositivo ortopdico que se puede quitar, proceda como se lo haya indicado el mdico. Ponga el hielo en una bolsa plstica. Coloque una toalla entre la piel y Therapist, nutritional. Aplique el hielo durante 20 minutos, 2 o 3 veces por da. Retire el hielo si la piel se le pone de color rojo brillante. Esto es [employer]. Si no puede sentir dolor, calor o fro, tiene un mayor riesgo de que se dae la zona. Si se lo indican, use una venda elstica para ejercer presin (compresin) en la  rodilla lesionada. Cuando est sentado o acostado, eleve la rodilla por encima del nivel del corazn. Duerma con una almohada debajo de la rodilla. Indicaciones generales Use los medicamentos de venta libre y los recetados solamente como se lo haya indicado el mdico. No fume ni consuma ningn producto que contenga nicotina o tabaco. Si necesita ayuda para dejar de consumir estos productos, consulte al mdico. Si tiene sobrepeso, trabaje con su mdico y un experto en alimentos (nutricionista) para establecer metas para bajar de peso. El sobrepeso puede aumentar el dolor de rodilla. Controle si hay algn cambio en sus sntomas. Cumpla con todas las visitas de seguimiento. Comunquese con un mdico si: El dolor de rodilla no desaparece. El dolor de rodilla cambia o [place]. Tiene fiebre junto con dolor de rodilla. La rodilla est enrojecida o se siente caliente al tacto. La rodilla le falla o se le queda trabada. Solicite ayuda de inmediato si: La rodilla se le hincha, y la Counselling psychologist. No puede mover la rodilla. Tiene un dolor muy intenso en la rodilla que no se alivia con analgsicos. Resumen El dolor de rodilla puede tener muchas causas. A menudo, el dolor desaparece solo con el tiempo y con reposo. El mdico puede hacerle estudios para Neurosurgeon la causa del dolor. Controle si hay algn cambio en sus sntomas. [employer] dolor con descanso, medicamentos, actividad de poca intensidad y [place] de hielo. Solicite ayuda de inmediato si no puede mover la rodilla o si el dolor de rodilla es muy intenso. Esta informacin no tiene Marine scientist el consejo del mdico. Asegrese de hacerle al mdico cualquier pregunta que tenga. Document Revised: 02/21/2020 Document Reviewed: 02/21/2020 Elsevier Patient Education  [place], MD [facility]

## 2022-03-31 ENCOUNTER — Ambulatory Visit (INDEPENDENT_AMBULATORY_CARE_PROVIDER_SITE_OTHER): Payer: Self-pay | Admitting: Sports Medicine

## 2022-03-31 VITALS — BP 128/78 | HR 57 | Ht 62.0 in | Wt 228.0 lb

## 2022-03-31 DIAGNOSIS — M1711 Unilateral primary osteoarthritis, right knee: Secondary | ICD-10-CM

## 2022-03-31 DIAGNOSIS — M25561 Pain in right knee: Secondary | ICD-10-CM

## 2022-03-31 DIAGNOSIS — G8929 Other chronic pain: Secondary | ICD-10-CM

## 2022-03-31 NOTE — Progress Notes (Signed)
    Lauren Andersen D.[place] Phone: [phone]   Assessment and Plan:     1. Chronic pain of right knee 2. Primary osteoarthritis of right knee  -Chronic with exacerbation, initial sports medicine visit - Consistent with osteoarthritis with acute flare based on HPI, physical exam, x-ray imaging - Discussed treatment options with patient.  She declined CSI at today's visit, but can consider it at future visit - Start meloxicam 15 mg daily x2 weeks.  If still having pain after 2 weeks, complete 3rd-week of meloxicam. May use remaining meloxicam as needed once daily for pain control.  Do not to use additional NSAIDs while taking meloxicam.  May use Tylenol [phone] mg 2 to 3 times a day for breakthrough pain. - Start HEP for knee - X-ray obtained in clinic.  My interpretation: No acute fracture or dislocation.  Decreased joint space most significant in medial compartment  Pertinent previous records reviewed include internal medicine note 03/26/2022   Follow Up: 3 to 4 weeks for reevaluation.  Would consider intra-articular CSI if no improvement or worsening of symptoms   Subjective:   I, Lauren Andersen, am serving as a Education administrator for Doctor Glennon Mac  Chief Complaint: right knee pain   HPI:   03/31/22 Patient is a 51 year old female complaining of right knee pain. Patient states that she  has had pain since June , increased her walking , from being sedentary , no MOI, no locking clicking or popping, no numbness or tingling , took tylenol for the pain but it didn't really do much for the pain, pain walking up and down stair, knee feel likes its going to give out when she is picking up her leg   Relevant Historical Information: None pertinent  Additional pertinent review of systems negative.   Current Outpatient Medications:    fish oil-omega-3 fatty acids 1000 MG capsule, Take 1 g by mouth daily. Reported  on 08/15/2015, Disp: , Rfl:    meloxicam (MOBIC) 15 MG tablet, Take 1 tablet (15 mg total) by mouth daily., Disp: 30 tablet, Rfl: 0   Multiple Vitamin (MULTIVITAMIN) tablet, Take 1 tablet by mouth daily., Disp: , Rfl:    Objective:     Vitals:   03/31/22 1441  BP: 128/78  Pulse: (!) 57  SpO2: 96%  Weight: 228 lb (103.4 kg)  Height: '5\' 2"'$  (1.575 m)      Body mass index is 41.7 kg/m.    Physical Exam:    General:  awake, alert oriented, no acute distress nontoxic Skin: no suspicious lesions or rashes Neuro:sensation intact, no deficits, strength 5/5 with no deficits, no atrophy, normal muscle tone Psych: No signs of anxiety, depression or other mood disorder  Right knee: Crepitus present No swelling No deformity Neg fluid wave, joint milking ROM Flex 100, Ext 10 TTP medial and lateral joint line, inferior patella, medial femoral condyle NTTP over the quad tendon,   lat fem condyle, patella, plica, patella tendon, tibial tuberostiy, fibular head, posterior fossa, pes anserine bursa, gerdy's tubercle,  Neg anterior and posterior drawer Neg lachman Neg sag sign Negative varus stress Negative valgus stress Negative McMurray    Gait normal    Electronically signed by:  Lauren Andersen D.[facility] 4:34 PM 03/31/22

## 2022-03-31 NOTE — Patient Instructions (Addendum)
Good to see you  - Start meloxicam 15 mg daily x2 weeks.  If still having pain after 2 weeks, complete 3rd-week of meloxicam. May use remaining meloxicam as needed once daily for pain control.  Do not to use additional NSAIDs while taking meloxicam.  May use Tylenol [phone] mg 2 to 3 times a day for breakthrough pain. Knee HEP Continue taking tumeric  3 week follow up

## 2022-04-23 ENCOUNTER — Encounter: Payer: Self-pay | Admitting: Nurse Practitioner

## 2022-04-23 ENCOUNTER — Ambulatory Visit (INDEPENDENT_AMBULATORY_CARE_PROVIDER_SITE_OTHER): Payer: Self-pay | Admitting: Nurse Practitioner

## 2022-04-23 VITALS — BP 124/86 | HR 75 | Ht 62.0 in | Wt 232.0 lb

## 2022-04-23 DIAGNOSIS — K760 Fatty (change of) liver, not elsewhere classified: Secondary | ICD-10-CM

## 2022-04-23 DIAGNOSIS — Z1211 Encounter for screening for malignant neoplasm of colon: Secondary | ICD-10-CM

## 2022-04-23 DIAGNOSIS — R1011 Right upper quadrant pain: Secondary | ICD-10-CM

## 2022-04-23 MED ORDER — NA SULFATE-K SULFATE-MG SULF 17.5-3.13-1.6 GM/177ML PO SOLN: 1.0000 | Freq: Once | ORAL | 0 refills | Status: AC

## 2022-04-23 MED ORDER — OMEPRAZOLE 20 MG PO CPDR: 20.0000 mg | DELAYED_RELEASE_CAPSULE | Freq: Every day | ORAL | 3 refills | Status: DC

## 2022-04-23 NOTE — Patient Instructions (Addendum)
  If you are age 51 or younger, your body mass index should be between 19-25. Your Body mass index is 42.43 kg/m. If this is out of the aformentioned range listed, please consider follow up with your Primary Care Provider.   __________________________________________________________  The [facility] providers would like to encourage you to use [facility] to communicate with providers for non-urgent requests or questions.  Due to long hold times on the telephone, sending your provider a message by [facility] may be a faster and more efficient way to get a response.  Please allow 48 business hours for a response.  Please remember that this is for non-urgent requests.   Due to recent changes in healthcare laws, you may see the results of your imaging and laboratory studies on MyChart before your provider has had a chance to review them.  We understand that in some cases there may be results that are confusing or concerning to you. Not all laboratory results come back in the same time frame and the provider may be waiting for multiple results in order to interpret others.  Please give [place] 48 hours in order for your provider to thoroughly review all the results before contacting the office for clarification of your results.   We have sent the following medications to your pharmacy for you to pick up at your convenience: Omeprazole, Suprep  You have been scheduled for an endoscopy and colonoscopy. Please follow the written instructions given to you at your visit today. Please pick up your prep supplies at the pharmacy within the next 1-3 days. If you use inhalers (even only as needed), please bring them with you on the day of your procedure.   Follow low carb diet.   Work on slow weight loss.   Thank you for choosing me and [facility].  Tye Savoy, NP

## 2022-04-23 NOTE — Progress Notes (Signed)
Chief Complaint:  abdominal pain, needs screening colonoscopy   Assessment & Plan   #   51 yo female with chronic ( years), intermittent  postprandial RUQ pain sometimes associated with nausea / vomiting.  Greasy foods are definitely problematic. Symptoms intermittent symptoms for years, not any worse since starting Meloxicam a month ago. Delayed gastric emptying seems unlikely in setting of weight gain.  Cause of chronic pain not yet determined. RUQ and CT scan unrevealing. Query biliary dyskinesia. Doubt PUD but will rule it out.  Recently started Meloxicam. Will start Omeprazole 20 mg daily empirically.  We talked about an upper endoscopy, I explained that it would likely be low yield but she is willing to proceed . The risks and benefits of EGD with possible biopsies were discussed with the patient who agrees to proceed.  If EGD negative consider gallbladder emptying study   # Colon cancer screening.  Schedule for a screening colonoscopy. The risks and benefits of colonoscopy with possible polypectomy / biopsies were discussed and the patient agrees to proceed.   # Hepatic steatosis. Normal LFTs.  Discussed low carb diet for slow weight loss  # Small liver hemangioma, 35m  # Markedly enlarged uterus, follows with GYN.    ADDENDUM:  After completion of the visit the CMA let me know that patient decided to cancel the procedures once she received the projected costs.  She asked for referral where procedures could be done less expensively.  I did offer that we could proceed with an upper endoscopy and she can get a Cologuard instead of a screening colonoscopy as she is at average risk for colon cancer .  We did let her know that if the Cologuard was positive she was still need a colonoscopy. She didn't want to proceed at this time.     HPI:    Lauren Andersen a 51y.o. year old female known to Dr. SFuller Plan(2012) with a past medical history of chronic, intermittent RUQ  pain  See PMH / P[place]ryfor additional history  Spanish speaking female, here with interpretor. Referred by PCP for abdominal pain, screening colonoscopy, hepatic steatosis.   Patient was seen in 2012 by [name] evaluation of RUQ pain which had already resolved with dietary changes at the time of her visit.   Patient saw her PCP in late June for evaluation of RUQ pain . Hepatic function panel normal, CBC normal.  RUQ ultrasound negative except for hepatic steatosis. Subsequently a CT scan showed a small hiatal hernia, a markedly enlarged uterus, fatty liver and may be a 5 mm angioma  She describes chronic intermittent RUQ pain. She had it in 2009 and 2010.  She also had in 2012 because she saw [name] the same pain. The pain resolved but came back over two years ago. Pain is worse with eating.  Certain foods are worse than others such as ice cream, sweets and fatty foods. She gets associated nausea  / vomiting sometimes, especially after fatty food.  The pain, nausea and vomiting have not been associated with any weight loss in fact she has recently gained weight in her midsection.   Her bowel movements are generally normal.  No blood in stool . Certain foods cause constipation.   Previous Labs / Imaging::    Latest Ref Rng & Units 01/29/2022    3:56 PM 06/25/2020   11:21 AM 08/22/2014   10:27 AM  CBC  WBC 4.0 - 10.5  K/uL 7.9  6.0  7.0   Hemoglobin 12.0 - 15.0 g/dL 12.8  13.3  12.7   Hematocrit 36.0 - 46.0 % 39.7  40.2  38.5   Platelets 150.0 - 400.0 K/uL 233.0  259  257     Lab Results  Component Value Date   LIPASE 28.0 01/29/2022   July 2023 RUQ - showed hepatic steatosis.   03/11/2022 CT AP w contrast for abdominal pain showed small HH, markedly enlarged uterus, fatty, liver, maybe 5 mm hemangioma   Past Medical History:  Diagnosis Date   Fatty liver 2012   Headache(784.0)    Hepatic steatosis    Past Surgical History:  Procedure Laterality Date   bladder mesh  2008    BLADDER SURGERY     CESAREAN SECTION     x2   colon mesh     RECTAL PROLAPSE REPAIR  2008   TUBAL LIGATION     Family History  Problem Relation Age of Onset   Thyroid disease Mother    Mental illness Mother        bipolar disorder, depression   Breast cancer Cousin    Cancer Maternal Aunt    Cancer Maternal Aunt    Social History   Tobacco Use   Smoking status: Never   Smokeless tobacco: Never  Vaping Use   Vaping Use: Never used  Substance Use Topics   Alcohol use: No   Drug use: No   Current Outpatient Medications  Medication Sig Dispense Refill   Nutritional Supp - Diet Aids (FAT BURNER THERAPY PO) Take by mouth.     fish oil-omega-3 fatty acids 1000 MG capsule Take 1 g by mouth daily. Reported on 08/15/2015     meloxicam (MOBIC) 15 MG tablet Take 1 tablet (15 mg total) by mouth daily. 30 tablet 0   Multiple Vitamin (MULTIVITAMIN) tablet Take 1 tablet by mouth daily.     No current facility-administered medications for this visit.   No Known Allergies   Review of Systems: Positive for weight gain. All other systems reviewed and negative except where noted in HPI.   Wt Readings from Last 3 Encounters:  04/23/22 232 lb (105.2 kg)  03/31/22 228 lb (103.4 kg)  03/26/22 228 lb 8 oz (103.6 kg)    Physical Exam   Ht '5\' 2"'$  (1.575 m)   Wt 232 lb (105.2 kg)   BMI 42.43 kg/m  Constitutional:  Generally well appearing female in no acute distress. Psychiatric: Pleasant. Normal mood and affect. Behavior is normal. EENT: Pupils normal.  Conjunctivae are normal. No scleral icterus. Neck supple.  Cardiovascular: Normal rate, regular rhythm. No edema Pulmonary/chest: Effort normal and breath sounds normal. No wheezing, rales or rhonchi. Abdominal: Soft, nondistended, nontender. Bowel sounds active throughout. There are no masses palpable. No hepatomegaly. Neurological: Alert and oriented to person place and time. Skin: Skin is warm and dry. No rashes noted.  Tye Savoy, NP  04/23/2022, 2:40 PM  Cc:  Referring Provider [name], NP-C

## 2022-05-14 ENCOUNTER — Encounter: Payer: Self-pay | Admitting: Internal Medicine

## 2022-07-09 ENCOUNTER — Telehealth: Payer: Self-pay | Admitting: Sports Medicine

## 2022-07-09 NOTE — Telephone Encounter (Signed)
Patient came in stating that she was given a medication for her knee but it does not seem to be helping as she hoped. She would potentially like to continue with the injection that was discussed at her last visit with Dr Glennon Mac.  She is self pay and wanted to find out an average cost of what it would be.  Do you know what codes might would be used and I can figure the total?

## 2022-07-14 NOTE — Telephone Encounter (Signed)
Discussed with pt. An estimate provided to pt & scheduled for 07/23/22.

## 2022-07-22 NOTE — Progress Notes (Unsigned)
    Lauren Andersen D.[place] Phone: [phone]   Assessment and Plan:     There are no diagnoses linked to this encounter.  ***   Pertinent previous records reviewed include ***   Follow Up: ***     Subjective:   I, Lauren Andersen, am serving as a Education administrator for Doctor Lauren Andersen   Chief Complaint: right knee pain    HPI:    03/31/22 Patient is a 51 year old female complaining of right knee pain. Patient states that she  has had pain since June , increased her walking , from being sedentary , no MOI, no locking clicking or popping, no numbness or tingling , took tylenol for the pain but it didn't really do much for the pain, pain walking up and down stair, knee feel likes its going to give out when she is picking up her leg   07/23/2022 Patient states    Relevant Historical Information: None pertinent Additional pertinent review of systems negative.   Current Outpatient Medications:    fish oil-omega-3 fatty acids 1000 MG capsule, Take 1 g by mouth daily. Reported on 08/15/2015, Disp: , Rfl:    meloxicam (MOBIC) 15 MG tablet, Take 1 tablet (15 mg total) by mouth daily., Disp: 30 tablet, Rfl: 0   Multiple Vitamin (MULTIVITAMIN) tablet, Take 1 tablet by mouth daily., Disp: , Rfl:    Nutritional Supp - Diet Aids (FAT BURNER THERAPY PO), Take by mouth., Disp: , Rfl:    omeprazole (PRILOSEC) 20 MG capsule, Take 1 capsule (20 mg total) by mouth daily with breakfast., Disp: 30 capsule, Rfl: 3   Objective:     There were no vitals filed for this visit.    There is no height or weight on file to calculate BMI.    Physical Exam:    ***   Electronically signed by:  Lauren Andersen D.[facility] 7:39 AM 07/22/22

## 2022-07-23 ENCOUNTER — Ambulatory Visit (INDEPENDENT_AMBULATORY_CARE_PROVIDER_SITE_OTHER): Payer: Self-pay | Admitting: Sports Medicine

## 2022-07-23 VITALS — BP 118/84 | HR 75 | Ht 62.0 in | Wt 237.0 lb

## 2022-07-23 DIAGNOSIS — M1711 Unilateral primary osteoarthritis, right knee: Secondary | ICD-10-CM

## 2022-07-23 DIAGNOSIS — G8929 Other chronic pain: Secondary | ICD-10-CM

## 2022-07-23 DIAGNOSIS — M25561 Pain in right knee: Secondary | ICD-10-CM

## 2022-07-23 NOTE — Patient Instructions (Addendum)
Good to see you Continue HEP 4 week follow up

## 2022-08-19 NOTE — Progress Notes (Deleted)
Lauren Andersen D.[place] Phone: [phone]   Assessment and Plan:     There are no diagnoses linked to this encounter.  ***   Pertinent previous records reviewed include ***   Follow Up: ***     Subjective:   I, Lauren Andersen, am serving as a Education administrator for Doctor Glennon Mac   Chief Complaint: right knee pain    HPI:    03/31/22 Patient is a 52 year old female complaining of right knee pain. Patient states that she  has had pain since June , increased her walking , from being sedentary , no MOI, no locking clicking or popping, no numbness or tingling , took tylenol for the pain but it didn't really do much for the pain, pain walking up and down stair, knee feel likes its going to give out when she is picking up her leg    07/23/2022 Patient states want another injection, was taking meloxciam as needed    08/20/2022 Patient states    Relevant Historical Information: None pertinent  Additional pertinent review of systems negative.   Current Outpatient Medications:    fish oil-omega-3 fatty acids 1000 MG capsule, Take 1 g by mouth daily. Reported on 08/15/2015, Disp: , Rfl:    meloxicam (MOBIC) 15 MG tablet, Take 1 tablet (15 mg total) by mouth daily., Disp: 30 tablet, Rfl: 0   Multiple Vitamin (MULTIVITAMIN) tablet, Take 1 tablet by mouth daily., Disp: , Rfl:    Nutritional Supp - Diet Aids (FAT BURNER THERAPY PO), Take by mouth., Disp: , Rfl:    omeprazole (PRILOSEC) 20 MG capsule, Take 1 capsule (20 mg total) by mouth daily with breakfast., Disp: 30 capsule, Rfl: 3   Objective:     There were no vitals filed for this visit.    There is no height or weight on file to calculate BMI.    Physical Exam:    ***   Electronically signed by:  Lauren Andersen D.[facility] 12:16 PM 08/19/22

## 2022-08-20 ENCOUNTER — Ambulatory Visit: Payer: Self-pay | Admitting: Sports Medicine

## 2022-09-09 NOTE — Progress Notes (Unsigned)
    Benito Mccreedy D.[place] Phone: [phone]   Assessment and Plan:     There are no diagnoses linked to this encounter.  ***   Pertinent previous records reviewed include ***   Follow Up: ***     Subjective:   I, Lauren Andersen, am serving as a Education administrator for Doctor Glennon Mac   Chief Complaint: right knee pain    HPI:    03/31/22 Patient is a 52 year old female complaining of right knee pain. Patient states that she  has had pain since June , increased her walking , from being sedentary , no MOI, no locking clicking or popping, no numbness or tingling , took tylenol for the pain but it didn't really do much for the pain, pain walking up and down stair, knee feel likes its going to give out when she is picking up her leg    07/23/2022 Patient states want another injection, was taking meloxciam as needed    09/10/2022 Patient states    Relevant Historical Information: None pertinent  Additional pertinent review of systems negative.   Current Outpatient Medications:    fish oil-omega-3 fatty acids 1000 MG capsule, Take 1 g by mouth daily. Reported on 08/15/2015, Disp: , Rfl:    meloxicam (MOBIC) 15 MG tablet, Take 1 tablet (15 mg total) by mouth daily., Disp: 30 tablet, Rfl: 0   Multiple Vitamin (MULTIVITAMIN) tablet, Take 1 tablet by mouth daily., Disp: , Rfl:    Nutritional Supp - Diet Aids (FAT BURNER THERAPY PO), Take by mouth., Disp: , Rfl:    omeprazole (PRILOSEC) 20 MG capsule, Take 1 capsule (20 mg total) by mouth daily with breakfast., Disp: 30 capsule, Rfl: 3   Objective:     There were no vitals filed for this visit.    There is no height or weight on file to calculate BMI.    Physical Exam:    ***   Electronically signed by:  Benito Mccreedy D.[facility] 12:48 PM 09/09/22

## 2022-09-10 ENCOUNTER — Ambulatory Visit (INDEPENDENT_AMBULATORY_CARE_PROVIDER_SITE_OTHER): Payer: Self-pay | Admitting: Sports Medicine

## 2022-09-10 VITALS — BP 132/84 | HR 79 | Ht 62.0 in | Wt 230.0 lb

## 2022-09-10 DIAGNOSIS — M25561 Pain in right knee: Secondary | ICD-10-CM

## 2022-09-10 DIAGNOSIS — M25562 Pain in left knee: Secondary | ICD-10-CM

## 2022-09-10 DIAGNOSIS — M1711 Unilateral primary osteoarthritis, right knee: Secondary | ICD-10-CM

## 2022-09-10 DIAGNOSIS — G8929 Other chronic pain: Secondary | ICD-10-CM

## 2022-09-10 MED ORDER — MELOXICAM 15 MG PO TABS: 15.0000 mg | ORAL_TABLET | Freq: Every day | ORAL | 0 refills | Status: DC

## 2022-09-10 NOTE — Patient Instructions (Addendum)
Good to see you - Start meloxicam 15 mg daily x3 weeks.  May use Tylenol [phone] 2 to 3 times a day for breakthrough pain. Knee HEP  4 week follow up

## 2022-10-08 NOTE — Progress Notes (Unsigned)
    Benito Mccreedy D.[place] Phone: [phone]   Assessment and Plan:     There are no diagnoses linked to this encounter.  ***   Pertinent previous records reviewed include ***   Follow Up: ***     Subjective:   I, Joal Eakle, am serving as a Education administrator for Doctor Glennon Mac   Chief Complaint: right knee pain    HPI:    03/31/22 Patient is a 52 year old female complaining of right knee pain. Patient states that she  has had pain since June , increased her walking , from being sedentary , no MOI, no locking clicking or popping, no numbness or tingling , took tylenol for the pain but it didn't really do much for the pain, pain walking up and down stair, knee feel likes its going to give out when she is picking up her leg    07/23/2022 Patient states want another injection, was taking meloxciam as needed    09/10/2022 Patient states that her right knee is doing well a month ago she fell and hurt her left knee   10/09/2022 Patient states      Relevant Historical Information: None pertinent  Additional pertinent review of systems negative.   Current Outpatient Medications:    fish oil-omega-3 fatty acids 1000 MG capsule, Take 1 g by mouth daily. Reported on 08/15/2015, Disp: , Rfl:    meloxicam (MOBIC) 15 MG tablet, Take 1 tablet (15 mg total) by mouth daily., Disp: 30 tablet, Rfl: 0   meloxicam (MOBIC) 15 MG tablet, Take 1 tablet (15 mg total) by mouth daily., Disp: 21 tablet, Rfl: 0   Multiple Vitamin (MULTIVITAMIN) tablet, Take 1 tablet by mouth daily., Disp: , Rfl:    Nutritional Supp - Diet Aids (FAT BURNER THERAPY PO), Take by mouth., Disp: , Rfl:    omeprazole (PRILOSEC) 20 MG capsule, Take 1 capsule (20 mg total) by mouth daily with breakfast., Disp: 30 capsule, Rfl: 3   Objective:     There were no vitals filed for this visit.    There is no height or weight on file to calculate  BMI.    Physical Exam:    ***   Electronically signed by:  Benito Mccreedy D.[facility] 8:43 AM 10/08/22

## 2022-10-09 ENCOUNTER — Ambulatory Visit (INDEPENDENT_AMBULATORY_CARE_PROVIDER_SITE_OTHER): Payer: Self-pay | Admitting: Sports Medicine

## 2022-10-09 VITALS — HR 78 | Ht 62.0 in | Wt 234.0 lb

## 2022-10-09 DIAGNOSIS — M1711 Unilateral primary osteoarthritis, right knee: Secondary | ICD-10-CM

## 2022-10-09 DIAGNOSIS — M25561 Pain in right knee: Secondary | ICD-10-CM

## 2022-10-09 DIAGNOSIS — G8929 Other chronic pain: Secondary | ICD-10-CM

## 2022-10-09 DIAGNOSIS — M25562 Pain in left knee: Secondary | ICD-10-CM

## 2022-10-09 MED ORDER — MELOXICAM 15 MG PO TABS: 15.0000 mg | ORAL_TABLET | Freq: Every day | ORAL | 0 refills | Status: DC | PRN

## 2022-10-09 NOTE — Patient Instructions (Addendum)
Good to see you  - Start meloxicam 15 mg daily as needed  Tylenol [phone] mg 2-3 times a day for pain relief  As needed follow up for repeat injection

## 2022-12-31 ENCOUNTER — Ambulatory Visit (INDEPENDENT_AMBULATORY_CARE_PROVIDER_SITE_OTHER): Payer: Self-pay

## 2022-12-31 ENCOUNTER — Ambulatory Visit (HOSPITAL_COMMUNITY)
Admission: EM | Admit: 2022-12-31 | Discharge: 2022-12-31 | Disposition: A | Discharge status: Home / Self Care | Payer: Self-pay | Attending: Nurse Practitioner | Admitting: Nurse Practitioner

## 2022-12-31 ENCOUNTER — Encounter (HOSPITAL_COMMUNITY): Payer: Self-pay

## 2022-12-31 DIAGNOSIS — M549 Dorsalgia, unspecified: Secondary | ICD-10-CM

## 2022-12-31 DIAGNOSIS — M67911 Unspecified disorder of synovium and tendon, right shoulder: Secondary | ICD-10-CM

## 2022-12-31 DIAGNOSIS — M25511 Pain in right shoulder: Secondary | ICD-10-CM

## 2022-12-31 MED ORDER — METHYLPREDNISOLONE 4 MG PO TBPK: ORAL_TABLET | ORAL | 0 refills | Status: AC

## 2022-12-31 NOTE — Discharge Instructions (Signed)
You may have tendinopathy of the right rotator cuff.  You will be treated with a Medrol Dosepak since the anti-inflammatories have not been good.  You are to take as directed Continue to use the Salonpas patches.  You could also try ice to see if this offers any relief.  If symptoms persist or get worse the recommendation would be to follow-up with primary care provider for further  physical therapy referral and/or imaging or possible referral to orthopedist  .

## 2022-12-31 NOTE — ED Triage Notes (Signed)
Pt reports back pain,shoulder pain and arm x 1 week. Pt is taking Motrin with no relief. Denies any falls or trauma.

## 2022-12-31 NOTE — ED Provider Notes (Addendum)
[facility]ENTER    CSN: [id number] Arrival date & time: 12/31/22  1341      History   Chief Complaint Chief Complaint  Patient presents with   Shoulder Pain   Arm Pain   Back Pain    HPI Myoshi Giering Gabriellia Houston is a 52 y.o. female.   HPI  She is in today for evaluation of her right shoulder and upper back.  She reports that she lays on her right side.  She is having some prickly feeling to her right arm along with small amount of swelling.  She denies any accident or injury.  Job is nonstrenuous.  She has been taking ibuprofen, using Salonpas patches along with topical rubs.  She also received the massage on Sunday with no relief.  She denies any shortness of breath, wheezing, chest pain. Past Medical History:  Diagnosis Date   Abdominal pain    Fatty liver 08/04/2010   Headache(784.0)    Hepatic steatosis     Patient Active Problem List   Diagnosis Date Noted   Acute pain of right knee 03/26/2022   History of prediabetes 01/29/2022   Obesity, Class III, BMI 40-49.9 (morbid obesity) (HCC) 01/29/2022   History of cyst of breast 01/29/2022   Abdominal pain, RUQ 01/29/2022   Screen for colon cancer 01/29/2022   Dyspareunia, female 10/08/2020   Fibroid 10/08/2020   High-tone pelvic floor dysfunction 10/08/2020   Pelvic pressure in female 10/08/2020   PVD (peripheral vascular disease) (HCC) 07/23/2020   Pain of both breasts 08/22/2014   Pap smear for cervical cancer screening 08/22/2014   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 Authorizing Provider  fish oil-omega-3 fatty acids 1000 MG capsule Take 1 g by mouth daily. Reported on 08/15/2015    [provider]  meloxicam (MOBIC) 15 MG tablet Take 1 tablet (15 mg total) by mouth daily as needed for pain. 10/09/22   Richardean Sale, DO  Multiple Vitamin (MULTIVITAMIN) tablet Take 1 tablet by mouth daily.    [provider]  Nutritional Supp - Diet Aids (FAT BURNER THERAPY PO) Take by mouth.    [provider]  omeprazole (PRILOSEC) 20 MG capsule Take 1 capsule (20 mg total) by mouth daily with breakfast. 04/23/22   Meredith Pel, NP    Family History Family History  Problem Relation Age of Onset   Thyroid disease Mother    Mental illness Mother        bipolar disorder, depression   Colon polyps Mother    Cancer Maternal Aunt  Cancer Maternal Aunt    Breast cancer Cousin    Stomach cancer Neg Hx    Esophageal cancer Neg Hx    Colon cancer Neg Hx     Social History Social History   Tobacco Use   Smoking status: Never   Smokeless tobacco: Never  Vaping Use   Vaping Use: Never used  Substance Use Topics   Alcohol use: No   Drug use: No     Allergies   Patient has no known allergies.   Review of Systems Review of Systems   Physical Exam Triage Vital Signs ED Triage Vitals [12/31/22 1417]  Enc Vitals Group     BP 139/70     Pulse Rate 68     Resp 18     Temp 98.5 F (36.9 C)     Temp Source Oral     SpO2 98 %     Weight      Height      Head Circumference      Peak Flow      Pain Score      Pain Loc      Pain Edu?      Excl. in GC?    No data found.  Updated Vital Signs BP 139/70 (BP Location: Left Arm)   Pulse 68   Temp 98.5 F (36.9 C) (Oral)   Resp 18   SpO2 98%   Visual Acuity Right Eye Distance:   Left Eye Distance:   Bilateral Distance:    Right Eye Near:   Left Eye Near:     Bilateral Near:     Physical Exam Constitutional:      General: She is not in acute distress.    Appearance: She is obese. She is not ill-appearing or diaphoretic.  HENT:     Head: Normocephalic and atraumatic.  Cardiovascular:     Rate and Rhythm: Normal rate.  Pulmonary:     Effort: Pulmonary effort is normal.  Musculoskeletal:     Right shoulder: Decreased range of motion. Decreased strength.     Left shoulder: Normal.     Right upper arm: Tenderness present.     Left upper arm: Normal.     Thoracic back: Tenderness present.  Neurological:     Mental Status: She is alert.      UC Treatments / Results  Labs (all labs ordered are listed, but only abnormal results are displayed) Labs Reviewed - No data to display  EKG   Radiology DG Thoracic Spine 2 View  Result Date: 12/31/2022 CLINICAL DATA:  Back pain. EXAM: THORACIC SPINE 2 VIEWS COMPARISON:  None Available. FINDINGS: There is no evidence of thoracic spine fracture. Alignment is normal. Mild osteophyte formation is noted at multiple levels in the midthoracic spine. IMPRESSION: Mild multilevel degenerative changes.  No acute abnormality seen. Electronically Signed   By: Lupita Raider M.D.   On: 12/31/2022 15:47   DG Shoulder Right  Result Date: 12/31/2022 CLINICAL DATA:  pain and swelling EXAM: RIGHT SHOULDER - 2+ VIEW COMPARISON:  None Available. FINDINGS: Normal alignment. No acute fracture. Linear density adjacent to the humeral head on the internal rotation view likely related to the rotator cuff. Visualized soft tissues are otherwise unremarkable. IMPRESSION: No acute osseous abnormality.  Probable rotator cuff tendinopathy. Electronically Signed   By: Olive Bass M.D.   On: 12/31/2022 15:42    Procedures Procedures (including critical care time)  Medications Ordered in UC Medications - No data  to display  Initial Impression / Assessment and Plan / UC Course  I have reviewed the triage vital signs and  the nursing notes.  Pertinent labs & imaging results that were available during my care of the patient were reviewed by me and considered in my medical decision making (see chart for details).     Upper back and shoulder pain Final Clinical Impressions(s) / UC Diagnoses   Final diagnoses:  Acute pain of right shoulder  Upper back pain  Tendinopathy of right rotator cuff     Discharge Instructions      You may have tendinopathy of the right rotator cuff.  You will be treated with a Medrol Dosepak since the anti-inflammatories have not been good.  You are to take as directed Continue to use the Salonpas patches.  You could also try ice to see if this offers any relief.  If symptoms persist or get worse the recommendation would be to follow-up with primary care provider for further  physical therapy referral and/or imaging or possible referral to orthopedist  .     ED Prescriptions   None    PDMP not reviewed this encounter.   Barbette Merino, NP 12/31/22 1536    Barbette Merino, NP 12/31/22 1610

## 2023-09-17 ENCOUNTER — Ambulatory Visit (INDEPENDENT_AMBULATORY_CARE_PROVIDER_SITE_OTHER): Payer: No Typology Code available for payment source | Admitting: Family Medicine

## 2023-09-17 VITALS — BP 126/84 | HR 90 | Temp 97.6°F | Ht 62.0 in | Wt 225.0 lb

## 2023-09-17 DIAGNOSIS — K760 Fatty (change of) liver, not elsewhere classified: Secondary | ICD-10-CM

## 2023-09-17 DIAGNOSIS — R1011 Right upper quadrant pain: Secondary | ICD-10-CM | POA: Diagnosis not present

## 2023-09-17 DIAGNOSIS — Z1211 Encounter for screening for malignant neoplasm of colon: Secondary | ICD-10-CM

## 2023-09-17 DIAGNOSIS — Z0001 Encounter for general adult medical examination with abnormal findings: Secondary | ICD-10-CM | POA: Diagnosis not present

## 2023-09-17 DIAGNOSIS — Z1159 Encounter for screening for other viral diseases: Secondary | ICD-10-CM

## 2023-09-17 DIAGNOSIS — M62838 Other muscle spasm: Secondary | ICD-10-CM | POA: Diagnosis not present

## 2023-09-17 DIAGNOSIS — Z1231 Encounter for screening mammogram for malignant neoplasm of breast: Secondary | ICD-10-CM | POA: Diagnosis not present

## 2023-09-17 DIAGNOSIS — M25561 Pain in right knee: Secondary | ICD-10-CM

## 2023-09-17 DIAGNOSIS — G8929 Other chronic pain: Secondary | ICD-10-CM

## 2023-09-17 DIAGNOSIS — Z780 Asymptomatic menopausal state: Secondary | ICD-10-CM

## 2023-09-17 DIAGNOSIS — D219 Benign neoplasm of connective and other soft tissue, unspecified: Secondary | ICD-10-CM

## 2023-09-17 DIAGNOSIS — K219 Gastro-esophageal reflux disease without esophagitis: Secondary | ICD-10-CM

## 2023-09-17 LAB — COMPREHENSIVE METABOLIC PANEL
ALT: 17 U/L (ref 0–35)
AST: 17 U/L (ref 0–37)
Albumin: 4.4 g/dL (ref 3.5–5.2)
Alkaline Phosphatase: 44 U/L (ref 39–117)
BUN: 17 mg/dL (ref 6–23)
CO2: 29 meq/L (ref 19–32)
Calcium: 9.4 mg/dL (ref 8.4–10.5)
Chloride: 103 meq/L (ref 96–112)
Creatinine, Ser: 0.64 mg/dL (ref 0.40–1.20)
GFR: 101.69 mL/min (ref >60.00)
Glucose, Bld: 107 mg/dL — ABNORMAL HIGH (ref 70–99)
Potassium: 4 meq/L (ref 3.5–5.1)
Sodium: 140 meq/L (ref 135–145)
Total Bilirubin: 0.6 mg/dL (ref 0.2–1.2)
Total Protein: 7.8 g/dL (ref 6.0–8.3)

## 2023-09-17 LAB — CBC WITH DIFFERENTIAL/PLATELET
Basophils Absolute: 0.1 10*3/uL (ref 0.0–0.1)
Basophils Relative: 0.9 % (ref 0.0–3.0)
Eosinophils Absolute: 0.5 10*3/uL (ref 0.0–0.7)
Eosinophils Relative: 7.4 % — ABNORMAL HIGH (ref 0.0–5.0)
HCT: 40.8 % (ref 36.0–46.0)
Hemoglobin: 13.4 g/dL (ref 12.0–15.0)
Lymphocytes Relative: 31.3 % (ref 12.0–46.0)
Lymphs Abs: 2 10*3/uL (ref 0.7–4.0)
MCHC: 32.9 g/dL (ref 30.0–36.0)
MCV: 90.2 fL (ref 78.0–100.0)
Monocytes Absolute: 0.5 10*3/uL (ref 0.1–1.0)
Monocytes Relative: 7.4 % (ref 3.0–12.0)
Neutro Abs: 3.4 10*3/uL (ref 1.4–7.7)
Neutrophils Relative %: 53 % (ref 43.0–77.0)
Platelets: 242 10*3/uL (ref 150.0–400.0)
RBC: 4.52 Mil/uL (ref 3.87–5.11)
RDW: 13.9 % (ref 11.5–15.5)
WBC: 6.4 10*3/uL (ref 4.0–10.5)

## 2023-09-17 LAB — FERRITIN: Ferritin: 64.7 ng/mL (ref 10.0–291.0)

## 2023-09-17 LAB — LIPID PANEL
Cholesterol: 208 mg/dL — ABNORMAL HIGH (ref 0–200)
HDL: 62.5 mg/dL (ref >39.00)
LDL Cholesterol: 128 mg/dL — ABNORMAL HIGH (ref 0–99)
NonHDL: 145.75
Total CHOL/HDL Ratio: 3
Triglycerides: 91 mg/dL (ref 0.0–149.0)
VLDL: 18.2 mg/dL (ref 0.0–40.0)

## 2023-09-17 LAB — TSH: TSH: 2.09 u[IU]/mL (ref 0.35–5.50)

## 2023-09-17 LAB — T4, FREE: Free T4: 0.72 ng/dL (ref 0.60–1.60)

## 2023-09-17 LAB — HEMOGLOBIN A1C: Hgb A1c MFr Bld: 6.5 % (ref 4.6–6.5)

## 2023-09-17 LAB — MAGNESIUM: Magnesium: 2.2 mg/dL (ref 1.5–2.5)

## 2023-09-17 NOTE — Progress Notes (Unsigned)
Complete physical exam  Patient: Lauren Andersen   DOB: [birth year]   52 y.o. Female  MRN: [id number]  Subjective:    Chief Complaint  Patient presents with   Annual Exam    fasting   She is here for a complete physical exam and to discussed chronic conditions.    Needs colon cancer screening   Right knee pain. Takes ibuprofen occasionally  Tricompartmental osteoarthritis seen on X ray  Hx of fatty liver and liver cyst vs hemangioma. She saw GI for this.   Enlarged uterus with fibroids.    IMPRESSION: There is no evidence of intestinal obstruction or pneumoperitoneum. There is no hydronephrosis.   Fatty liver.  Possible 5 mm cyst or hemangioma in the liver.   There is diffuse wall thickening of the urinary bladder which may be due to incomplete distention or cystitis.   Enlarged uterus with lobulated margins suggesting fibroids. Follow-up pelvic sonogram may be considered.   Other findings as described in the body of the report.   Electronically Signed   By: Ernie Avena M.D.   On: 03/11/2022 14:32   Health Maintenance  Topic Date Due   DTaP/Tdap/Td vaccine (1 - Tdap) Never done   Colon Cancer Screening  Never done   Zoster (Shingles) Vaccine (1 of 2) Never done   Mammogram  08/09/2021   Pap with HPV screening  03/25/2023   COVID-19 Vaccine (4 - 2024-25 season) 10/03/2023*   Flu Shot  11/02/2023*   Hepatitis C Screening  Completed   HIV Screening  Completed   HPV Vaccine  Aged Out  *Topic was postponed. The date shown is not the original due date.    Wears seatbelt always, smoke detectors in home and functioning, does not text while driving, feels safe in home environment.  Depression screening:    09/17/2023    9:01 AM 03/26/2022    2:27 PM 01/29/2022    3:17 PM  Depression screen PHQ 2/9  Decreased Interest 0 0 0  Down, Depressed, Hopeless 0 0 0  PHQ - 2 Score 0 0 0   Anxiety Screening:     No data to display           Patient Care Team: Avanell Shackleton, NP-C as PCP - General (Family Medicine) Claiborne Rigg, NP (Nurse Practitioner) Jim Like, RN as Registered Nurse Scarlett Presto, RN (Inactive) as Registered Nurse   Outpatient Medications Prior to Visit  Medication Sig   Multiple Vitamin (MULTIVITAMIN) tablet Take 1 tablet by mouth daily.   Nutritional Supp - Diet Aids (FAT BURNER THERAPY PO) Take by mouth.   [DISCONTINUED] fish oil-omega-3 fatty acids 1000 MG capsule Take 1 g by mouth daily. Reported on 08/15/2015   [DISCONTINUED] meloxicam (MOBIC) 15 MG tablet Take 1 tablet (15 mg total) by mouth daily as needed for pain.   [DISCONTINUED] omeprazole (PRILOSEC) 20 MG capsule Take 1 capsule (20 mg total) by mouth daily with breakfast.   No facility-administered medications prior to visit.    Review of Systems  Constitutional:  Negative for chills, fever and weight loss.  HENT:  Negative for congestion, ear pain, sinus pain and sore throat.   Eyes:  Negative for blurred vision, double vision and pain.  Respiratory:  Negative for cough, shortness of breath and wheezing.   Cardiovascular:  Negative for chest pain, palpitations and leg swelling.  Gastrointestinal:  Negative for abdominal pain, constipation, diarrhea, nausea and vomiting.  Genitourinary:  Negative  for dysuria, frequency and urgency.  Musculoskeletal:  Positive for joint pain. Negative for back pain and myalgias.       Muscle spasm of right forearm yesterday   Skin:  Negative for rash.  Neurological:  Negative for dizziness, tingling, focal weakness and headaches.  Psychiatric/Behavioral:  Negative for depression. The patient is not nervous/anxious.        Objective:    BP 126/84 (BP Location: Left Arm, Patient Position: Sitting)   Pulse 90   Temp 97.6 F (36.4 C) (Temporal)   Ht 5\' 2"  (1.575 m)   Wt 225 lb (102.1 kg)   SpO2 95%   BMI 41.15 kg/m  BP Readings from Last 3 Encounters:  09/17/23 126/84  12/31/22  139/70  09/10/22 132/84   Wt Readings from Last 3 Encounters:  09/18/23 225 lb (102.1 kg)  09/17/23 225 lb (102.1 kg)  10/09/22 234 lb (106.1 kg)    Physical Exam Constitutional:      General: She is not in acute distress.    Appearance: She is obese. She is not ill-appearing.  HENT:     Right Ear: Tympanic membrane, ear canal and external ear normal.     Left Ear: Tympanic membrane, ear canal and external ear normal.     Nose: Nose normal.     Mouth/Throat:     Mouth: Mucous membranes are moist.     Pharynx: Oropharynx is clear.  Eyes:     Extraocular Movements: Extraocular movements intact.     Conjunctiva/sclera: Conjunctivae normal.     Pupils: Pupils are equal, round, and reactive to light.  Neck:     Thyroid: No thyroid mass, thyromegaly or thyroid tenderness.  Cardiovascular:     Rate and Rhythm: Normal rate and regular rhythm.     Pulses: Normal pulses.     Heart sounds: Normal heart sounds.  Pulmonary:     Effort: Pulmonary effort is normal.     Breath sounds: Normal breath sounds.  Abdominal:     General: Bowel sounds are normal.     Palpations: Abdomen is soft.     Tenderness: There is abdominal tenderness in the right upper quadrant. There is no right CVA tenderness, left CVA tenderness, guarding or rebound. Negative signs include Murphy's sign and McBurney's sign.  Musculoskeletal:        General: Normal range of motion.     Cervical back: Normal range of motion and neck supple. No tenderness.     Right knee: Swelling present. Tenderness present over the medial joint line.     Right lower leg: No edema.     Left lower leg: No edema.  Lymphadenopathy:     Cervical: No cervical adenopathy.  Skin:    General: Skin is warm and dry.     Findings: No lesion or rash.  Neurological:     General: No focal deficit present.     Mental Status: She is alert and oriented to person, place, and time.     Cranial Nerves: No cranial nerve deficit.     Sensory: No  sensory deficit.     Motor: No weakness.     Gait: Gait normal.  Psychiatric:        Mood and Affect: Mood normal.        Behavior: Behavior normal.        Thought Content: Thought content normal.      Results for orders placed or performed in visit on 09/17/23  Magnesium  Result  Value Ref Range   Magnesium 2.2 1.5 - 2.5 mg/dL  Ferritin  Result Value Ref Range   Ferritin 64.7 10.0 - 291.0 ng/mL  HIV Antibody (routine testing w rflx)  Result Value Ref Range   HIV 1&2 Ab, 4th Generation NON-REACTIVE NON-REACTIVE  Hepatitis C antibody  Result Value Ref Range   Hepatitis C Ab NON-REACTIVE NON-REACTIVE  Hemoglobin A1c  Result Value Ref Range   Hgb A1c MFr Bld 6.5 4.6 - 6.5 %  Lipid panel  Result Value Ref Range   Cholesterol 208 (H) 0 - 200 mg/dL   Triglycerides 08.6 0.0 - 149.0 mg/dL   HDL 57.84 >69.62 mg/dL   VLDL 95.2 0.0 - 84.1 mg/dL   LDL Cholesterol 324 (H) 0 - 99 mg/dL   Total CHOL/HDL Ratio 3    NonHDL 145.75   T4, free  Result Value Ref Range   Free T4 0.72 0.60 - 1.60 ng/dL  TSH  Result Value Ref Range   TSH 2.09 0.35 - 5.50 uIU/mL  Comprehensive metabolic panel  Result Value Ref Range   Sodium 140 135 - 145 mEq/L   Potassium 4.0 3.5 - 5.1 mEq/L   Chloride 103 96 - 112 mEq/L   CO2 29 19 - 32 mEq/L   Glucose, Bld 107 (H) 70 - 99 mg/dL   BUN 17 6 - 23 mg/dL   Creatinine, Ser 4.01 0.40 - 1.20 mg/dL   Total Bilirubin 0.6 0.2 - 1.2 mg/dL   Alkaline Phosphatase 44 39 - 117 U/L   AST 17 0 - 37 U/L   ALT 17 0 - 35 U/L   Total Protein 7.8 6.0 - 8.3 g/dL   Albumin 4.4 3.5 - 5.2 g/dL   GFR 027.25 >36.64 mL/min   Calcium 9.4 8.4 - 10.5 mg/dL  CBC with Differential/Platelet  Result Value Ref Range   WBC 6.4 4.0 - 10.5 K/uL   RBC 4.52 3.87 - 5.11 Mil/uL   Hemoglobin 13.4 12.0 - 15.0 g/dL   HCT 40.3 47.4 - 25.9 %   MCV 90.2 78.0 - 100.0 fl   MCHC 32.9 30.0 - 36.0 g/dL   RDW 56.3 87.5 - 64.3 %   Platelets 242.0 150.0 - 400.0 K/uL   Neutrophils Relative % 53.0  43.0 - 77.0 %   Lymphocytes Relative 31.3 12.0 - 46.0 %   Monocytes Relative 7.4 3.0 - 12.0 %   Eosinophils Relative 7.4 (H) 0.0 - 5.0 %   Basophils Relative 0.9 0.0 - 3.0 %   Neutro Abs 3.4 1.4 - 7.7 K/uL   Lymphs Abs 2.0 0.7 - 4.0 K/uL   Monocytes Absolute 0.5 0.1 - 1.0 K/uL   Eosinophils Absolute 0.5 0.0 - 0.7 K/uL   Basophils Absolute 0.1 0.0 - 0.1 K/uL      Assessment & Plan:    Routine Health Maintenance and Physical Exam  Problem List Items Addressed This Visit     Chronic pain of right knee   Relevant Orders   Ambulatory referral to Sports Medicine   Fatty liver   Relevant Orders   CBC with Differential/Platelet (Completed)   Comprehensive metabolic panel (Completed)   Ambulatory referral to Gastroenterology   Fibroids   Gastroesophageal reflux disease   Relevant Orders   Ambulatory referral to Gastroenterology   Post-menopausal   RUQ abdominal pain   Relevant Orders   CBC with Differential/Platelet (Completed)   Comprehensive metabolic panel (Completed)   Ambulatory referral to Gastroenterology   Screen for colon cancer  Relevant Orders   Ambulatory referral to Gastroenterology   Severe obesity (BMI >= 40) (HCC)   Relevant Orders   CBC with Differential/Platelet (Completed)   Comprehensive metabolic panel (Completed)   TSH (Completed)   T4, free (Completed)   Lipid panel (Completed)   Hemoglobin A1c (Completed)   Other Visit Diagnoses       Encounter for general adult medical examination with abnormal findings    -  Primary     Screening mammogram for breast cancer       Relevant Orders   MM 3D SCREENING MAMMOGRAM BILATERAL BREAST     Muscle spasm       Relevant Orders   CBC with Differential/Platelet (Completed)   Comprehensive metabolic panel (Completed)   TSH (Completed)   T4, free (Completed)   Ferritin (Completed)   Magnesium (Completed)     Encounter for screening for other viral diseases       Relevant Orders   Hepatitis C antibody  (Completed)   HIV Antibody (routine testing w rflx) (Completed)      Preventive health care reviewed.  Counseling on healthy lifestyle including diet and exercise.  Recommend regular dental and eye exams.  Immunizations reviewed.  Discussed safety. Referral to GI for GERD, fatty liver and colon cancer screening  Check labs due to muscle spasm of arm.  Referral to sports medicine for chronic knee pain.  She will call to schedule her mammogram. She will also call to schedule with her OB/GYN. We reviewed imaging results regarding her enlarged uterus and fibroids.  Follow up pending labs or in 3 months.   Return in about 3 months (around 12/15/2023).     Hetty Blend, NP-C

## 2023-09-17 NOTE — Patient Instructions (Addendum)
Go downstairs for labs.   Please call and schedule with your OB/GYN   Please call to schedule your mammogram at [facility]   I am referring you to [facility].   We will be in touch with your labs.

## 2023-09-17 NOTE — Progress Notes (Signed)
Please call her and let her know that her cholesterol is slightly elevated so I recommend she work on eating a diet low in saturated fat and limit fried foods.  Her blood sugars are better than last year but still at the borderline of diabetes.  Her labs otherwise are fine.  If she would like to start on medication to help with her blood sugars as well as weight loss, I can prescribe her a once weekly injection or a pill called metformin.  If she would like to start the once weekly injection, then I would like for her to come back in to see me in the next week or two.

## 2023-09-18 ENCOUNTER — Ambulatory Visit: Payer: No Typology Code available for payment source | Admitting: Sports Medicine

## 2023-09-18 ENCOUNTER — Telehealth: Payer: Self-pay | Admitting: Family Medicine

## 2023-09-18 VITALS — HR 76 | Ht 62.0 in | Wt 225.0 lb

## 2023-09-18 DIAGNOSIS — M25562 Pain in left knee: Secondary | ICD-10-CM | POA: Diagnosis not present

## 2023-09-18 DIAGNOSIS — G8929 Other chronic pain: Secondary | ICD-10-CM | POA: Diagnosis not present

## 2023-09-18 DIAGNOSIS — M1711 Unilateral primary osteoarthritis, right knee: Secondary | ICD-10-CM

## 2023-09-18 DIAGNOSIS — M25561 Pain in right knee: Secondary | ICD-10-CM

## 2023-09-18 LAB — HEPATITIS C ANTIBODY: Hepatitis C Ab: NONREACTIVE

## 2023-09-18 LAB — HIV ANTIBODY (ROUTINE TESTING W REFLEX): HIV 1&2 Ab, 4th Generation: NONREACTIVE

## 2023-09-18 NOTE — Telephone Encounter (Signed)
Patient came in and said that her imaging can not be done at the breast center due to her insurance.   An order will nee to be sent to [facility] - at [facility].  Please let patient know when order has been placed

## 2023-09-18 NOTE — Progress Notes (Signed)
Lauren Andersen D.[name] [address] [place] [address] Phone: [phone]   Assessment and Plan:     1. Primary osteoarthritis of right knee 2. Chronic pain of right knee 3. Chronic pain of left knee  -Chronic with exacerbation, subsequent visit - Consistent with recurrent flare of right knee osteoarthritis, likely leading to compensatory pain in left knee - Patient has previously received moderate relief with intra-articular CSI.  Elects for repeat CSI at today's visit.  Tolerated well per note below.  CSI may temporarily increase blood glucose in patient with past medical history of hyperglycemia with most recent A1c 6.5 - Start Tylenol 500 to 1000 mg tablets 2-3 times a day for day-to-day pain relief - Restart HEP for bilateral knees  Procedure: Knee Joint Injection Side: Right Indication: Flare of osteoarthritis  Risks explained and consent was given verbally. The site was cleaned with alcohol prep. A needle was introduced with an anterio-lateral approach. Injection given using 2mL of 1% lidocaine without epinephrine and 1mL of kenalog 40mg /ml. This was well tolerated and resulted in symptomatic relief.  Needle was removed, hemostasis achieved, and post injection instructions were explained.   Pt was advised to call or return to clinic if these symptoms worsen or fail to improve as anticipated.   Pertinent previous records reviewed include none  Follow Up: 3 to 4 weeks for reevaluation.  Could consider alternative injection for right knee since patient now has insurance that could potentially cover Zilretta versus HA injections.  Can further discuss left knee injection.  Could further discuss physical therapy   Subjective:   I, Lauren Andersen, am serving as a Neurosurgeon for Doctor Richardean Sale   Chief Complaint: right knee pain    HPI:    03/31/22 Patient is a 53 year old female complaining of right knee pain. Patient states that  she  has had pain since June , increased her walking , from being sedentary , no MOI, no locking clicking or popping, no numbness or tingling , took tylenol for the pain but it didn't really do much for the pain, pain walking up and down stair, knee feel likes its going to give out when she is picking up her leg    07/23/2022 Patient states want another injection, was taking meloxciam as needed    09/10/2022 Patient states that her right knee is doing well a month ago she fell and hurt her left knee    10/09/2022 Patient states she has pain in the right knee she started walking in the morning and the pain came back , stopped taking meloxicam and the pain was manageable but now that she is out she has pain    09/18/2023 Patient states knee pain and inflammation    Relevant Historical Information: None pertinent  Additional pertinent review of systems negative.   Current Outpatient Medications:    Multiple Vitamin (MULTIVITAMIN) tablet, Take 1 tablet by mouth daily., Disp: , Rfl:    Nutritional Supp - Diet Aids (FAT BURNER THERAPY PO), Take by mouth., Disp: , Rfl:    Objective:     Vitals:   09/18/23 0959  Pulse: 76  SpO2: 98%  Weight: 225 lb (102.1 kg)  Height: 5\' 2"  (1.575 m)      Body mass index is 41.15 kg/m.    Physical Exam:    General:  awake, alert oriented, no acute distress nontoxic Skin: no suspicious lesions or rashes Neuro:sensation intact, no deficits, strength 5/5  with no deficits, no atrophy, normal muscle tone Psych: No signs of anxiety, depression or other mood disorder   Bilateral knee: Crepitus present No swelling No deformity Neg fluid wave, joint milking ROM Flex 100, Ext 10 Right TTP medial and lateral joint line, inferior patella, medial femoral condyle NTTP over the quad tendon,   lat fem condyle, patella, plica, patella tendon, tibial tuberostiy, fibular head, posterior fossa, pes anserine bursa, gerdy's tubercle,  Neg anterior and posterior  drawer Neg lachman Neg sag sign Negative varus stress Negative valgus stress Negative McMurray for palpable pop, though reproduced pain on right   Gait normal       Electronically signed by:  Lauren Andersen D.[name] 12:29 PM 09/18/23

## 2023-09-18 NOTE — Patient Instructions (Signed)
Tylenol for day to day pain. Continue HEP. Follow up in 3 to 4 weeks.

## 2023-09-21 NOTE — Telephone Encounter (Signed)
 Orders changed.

## 2023-09-25 ENCOUNTER — Ambulatory Visit: Payer: No Typology Code available for payment source | Admitting: Family Medicine

## 2023-09-25 ENCOUNTER — Encounter: Payer: Self-pay | Admitting: Family Medicine

## 2023-09-25 DIAGNOSIS — E1169 Type 2 diabetes mellitus with other specified complication: Secondary | ICD-10-CM

## 2023-09-25 DIAGNOSIS — Z7985 Long-term (current) use of injectable non-insulin antidiabetic drugs: Secondary | ICD-10-CM | POA: Diagnosis not present

## 2023-09-25 DIAGNOSIS — K760 Fatty (change of) liver, not elsewhere classified: Secondary | ICD-10-CM | POA: Diagnosis not present

## 2023-09-25 MED ORDER — TIRZEPATIDE 2.5 MG/0.5ML ~~LOC~~ SOAJ
2.5000 mg | SUBCUTANEOUS | 1 refills | Status: DC
Start: 2023-09-25 — End: 2023-11-30

## 2023-09-25 NOTE — Progress Notes (Signed)
Subjective:     Patient ID: Lauren Andersen, female    DOB: 1970/11/07, 53 y.o.   MRN: [id number]  Chief Complaint  Patient presents with   Weight Loss    Discuss glp1    HPI   History of Present Illness         She is here to discuss new onset diabetes with A1c 6.6% and now 6.5%.   She is in favor of starting a once weekly GLP-1     [facility] urogynecologist.  [facility]  Referred to [facility] for colonoscopy      Health Maintenance Due  Topic Date Due   Pneumococcal Vaccine 47-50 Years old (1 of 2 - PCV) Never done   FOOT EXAM  Never done   OPHTHALMOLOGY EXAM  Never done   Diabetic kidney evaluation - Urine ACR  Never done   DTaP/Tdap/Td (1 - Tdap) Never done   Colonoscopy  Never done   Zoster Vaccines- Shingrix (1 of 2) Never done   MAMMOGRAM  08/09/2021   Cervical Cancer Screening (HPV/Pap Cotest)  03/25/2023    Past Medical History:  Diagnosis Date   Abdominal pain    Arthritis Dec 2023   Fatty liver 08/04/2010   Headache(784.0)    Hepatic steatosis     Past Surgical History:  Procedure Laterality Date   bladder mesh  2008   BLADDER SURGERY     CESAREAN SECTION     x2   colon mesh     RECTAL PROLAPSE REPAIR  2008   TUBAL LIGATION      Family History  Problem Relation Age of Onset   Thyroid disease Mother    Mental illness Mother        bipolar disorder, depression   Colon polyps Mother    Depression Mother    Cancer Maternal Aunt    Cancer Maternal Aunt    Breast cancer Cousin    Stomach cancer Neg Hx    Esophageal cancer Neg Hx    Colon cancer Neg Hx     Social History   Socioeconomic History   Marital status: Married    Spouse name: Alecia Lemming   Number of children: 3   Years of education: Not on file   Highest education level: Bachelor's degree (e.g., BA, AB, BS)  Occupational History   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   Husband has a son from another partner, plus the three with the patient.   Social Drivers of Corporate investment banker Strain: Low Risk  (09/17/2023)   Overall Financial Resource Strain (CARDIA)    Difficulty of Paying Living Expenses: Not very hard  Food Insecurity: No Food Insecurity (09/17/2023)   Hunger Vital Sign    Worried About Running Out of Food in the Last Year: Never true    Ran Out of Food in the Last Year: Never true  Transportation Needs: No Transportation Needs (09/17/2023)   PRAPARE - Transportation  Lack of Transportation (Medical): No    Lack of Transportation (Non-Medical): No  Physical Activity: Insufficiently Active (09/17/2023)   Exercise Vital Sign    Days of Exercise per Week: 4 days    Minutes of Exercise per Session: 20 min  Stress: No Stress Concern Present (09/17/2023)   [employer] of Occupational Health - Occupational Stress Questionnaire    Feeling of Stress : Only a little  Social Connections: Socially Integrated (09/17/2023)   Social Connection and Isolation Panel [NHANES]    Frequency of Communication with Friends and Family: More than three times a week    Frequency of Social Gatherings with Friends and Family: Three times a week    Attends Religious Services: More than 4 times per year    Active Member of Clubs or Organizations: Yes    Attends Banker Meetings: More than 4 times per year    Marital Status: Married  Catering manager Violence: Not on file    Outpatient Medications Prior to Visit  Medication Sig Dispense Refill   Multiple Vitamin (MULTIVITAMIN) tablet Take 1 tablet by mouth daily.      Nutritional Supp - Diet Aids (FAT BURNER THERAPY PO) Take by mouth.     No facility-administered medications prior to visit.    No Known Allergies  Review of Systems  Constitutional:  Negative for chills and fever.  Respiratory:  Negative for cough and shortness of breath.   Cardiovascular:  Negative for chest pain and palpitations.  Gastrointestinal:  Negative for abdominal pain, constipation, diarrhea, nausea and vomiting.  Genitourinary:  Negative for dysuria, frequency and urgency.  Neurological:  Negative for dizziness and focal weakness.       Objective:    Physical Exam Constitutional:      General: She is not in acute distress.    Appearance: She is obese. She is not ill-appearing.  Eyes:     Extraocular Movements: Extraocular movements intact.     Conjunctiva/sclera: Conjunctivae normal.  Cardiovascular:     Rate and Rhythm: Normal rate.  Pulmonary:     Effort: Pulmonary effort is normal.  Musculoskeletal:     Cervical back: Normal range of motion and neck supple.  Skin:    General: Skin is warm and dry.  Neurological:     General: No focal deficit present.     Mental Status: She is alert and oriented to person, place, and time.  Psychiatric:        Mood and Affect: Mood normal.        Behavior: Behavior normal.        Thought Content: Thought content normal.      BP 116/78 (BP Location: Left Arm, Patient Position: Sitting)   Pulse 80   Temp 97.8 F (36.6 C) (Temporal)   Ht 5\' 2"  (1.575 m)   Wt 218 lb (98.9 kg)   SpO2 96%   BMI 39.87 kg/m  Wt Readings from Last 3 Encounters:  09/25/23 218 lb (98.9 kg)  09/18/23 225 lb (102.1 kg)  09/17/23 225 lb (102.1 kg)       Assessment & Plan:   Problem List Items Addressed This Visit     Fatty liver   Type 2 diabetes mellitus with morbid obesity (HCC) - Primary   Relevant Medications   tirzepatide (MOUNJARO) 2.5 MG/0.5ML Pen   In-depth counseling on diabetes spectrum and managing blood sugars.  BMI  40 Start Mounjaro.  Counseling on how to take the medication, potential side effects and  how to be successful with the medication.  Counseling on healthy diet and increasing physical activity.  Recommend that she stay well-hydrated.  Recommend starting MiraLAX daily to prevent constipation. She has been referred to GI but has not scheduled. She is under the care of her OB/GYN and urogynecologist.  She will call and schedule her mammogram which was changed to [facility] due to insurance She will follow-up in 7 to 8 weeks after starting Mounjaro.  We discussed if the medication was not covered and not affordable then I will send in metformin once daily with breakfast for her diabetes and obesity. At her follow-up we will further discuss diabetes standard of care, start statin therapy and potentially ACE or ARB.  Recheck labs and also urine microalbumin at her follow-up  I am having Patrice Matthew. Ranae Palms Popayan start on tirzepatide. I am also having her maintain her multivitamin and Nutritional Supp - Diet Aids (FAT BURNER THERAPY PO).  Meds ordered this encounter  Medications   tirzepatide (MOUNJARO) 2.5 MG/0.5ML Pen    Sig: Inject 2.5 mg into the skin once a week.    Dispense:  2 mL    Refill:  1    Supervising Provider:   Hillard Danker A [[id number]]

## 2023-09-25 NOTE — Patient Instructions (Addendum)
Start Miralax 1 capful daily with 6-8 ounces of fluid when you start Mounjaro injections.   Stay hydrated. Eat small amounts of food. Low fat diet.   Let me know if side effects are severe.   Please come in to see me after your 7th Mounjaro injection.

## 2023-09-30 ENCOUNTER — Telehealth: Payer: Self-pay

## 2023-09-30 ENCOUNTER — Other Ambulatory Visit (HOSPITAL_COMMUNITY): Payer: Self-pay

## 2023-09-30 NOTE — Telephone Encounter (Signed)
 Pharmacy Patient Advocate Encounter   Received notification from  [facility] Portal that prior authorization for [facility] 2.5MG /0.5ML auto-injectors is required/requested.   Insurance verification completed.   The patient is insured through  [employer]  .   Per test claim: PA required; PA submitted to above mentioned insurance via CoverMyMeds Key/confirmation #/EOC [id number] Status is pending

## 2023-10-01 NOTE — Telephone Encounter (Signed)
 Lauren Andersen has been denied

## 2023-10-01 NOTE — Telephone Encounter (Signed)
 Pharmacy Patient Advocate Encounter  Received notification from  [employer]  that Prior Authorization for [facility] 2.5MG /0.5ML auto-injectors has been DENIED.  Full denial letter will be uploaded to the media tab. See denial reason below.  We denied the medical services/items listed above because:  Glucagon-Like Peptide-1 Receptor Agonist (GLP-1) Step Therapy criteria has not been met. To meet our criteria:  Your doctor must tell us: You have a documented trial and failure or intolerance with metformin at the maximally tolerated dose for a minimum of 3 months.  PA #/Case ID/Reference #: [id number]

## 2023-10-02 NOTE — Telephone Encounter (Signed)
 Called pt and she is reluctant to take any medications now that she has looked online at the possible side effects. She would like to try diet and exercise for a little while now that her knee is doing better (recently has received knee injections that have been helping).   Pt also reports [facility] has not received her mammogram order. I advised the order was placed but I will reach out to their office as they may need a hard copy of the order sent.

## 2023-10-05 NOTE — Telephone Encounter (Signed)
 Called [facility] and they report they did not have the order, gave the verbal orders over phone and staff states they will reach out to the pt to schedule.

## 2023-10-07 NOTE — Progress Notes (Signed)
 Lauren Andersen D.[facility] [address] [place] [address] Phone: [phone]   Assessment and Plan:     1. Primary osteoarthritis of right knee 2. Chronic pain of right knee 3. Chronic pain of left knee -Chronic with exacerbation, subsequent visit - Overall improvement in symptoms after intra-articular CSI on 09/18/2023.  Patient still has intermittent pain in bilateral knees, right worse than left, consistent with flare of osteoarthritis in right knee that did not fully resolve after CSI and compensatory pain in left knee - Continue HEP and start physical therapy for bilateral knees.  Physical therapy referral sent -Continue Tylenol 500 to 1000 mg tablets 2-3 times a day for day-to-day pain relief  -Patient may benefit in the future from Zilretta injections which may provide more complete and longer lasting pain relief as well as less effect on blood glucose in patient with past medical history of DM type II.  Patient is not interested in another injection at today's visit, though could be considered at future office visit  Interpreter was present throughout entirety of office visit and used when necessary for translation.  Pertinent previous records reviewed include none  Follow Up: 4 to 8 weeks for reevaluation.  Could consider alternative CSI for right knee such as Zilretta, or further evaluation of left knee with x-ray and CSI if no improvement.   Subjective:   I, Lauren Andersen, am serving as a Neurosurgeon for Doctor Richardean Sale   Chief Complaint: right knee pain    HPI:    03/31/22 Patient is a 53 year old female complaining of right knee pain. Patient states that she  has had pain since June , increased her walking , from being sedentary , no MOI, no locking clicking or popping, no numbness or tingling , took tylenol for the pain but it didn't really do much for the pain, pain walking up and down stair, knee feel likes its going to  give out when she is picking up her leg    07/23/2022 Patient states want another injection, was taking meloxciam as needed    09/10/2022 Patient states that her right knee is doing well a month ago she fell and hurt her left knee    10/09/2022 Patient states she has pain in the right knee she started walking in the morning and the pain came back , stopped taking meloxicam and the pain was manageable but now that she is out she has pain    09/18/2023 Patient states knee pain and inflammation   10/12/2023 Patient states pain is back not as strong but still there. Took about a week to kick in    Relevant Historical Information: None pertinent    Additional pertinent review of systems negative.   Current Outpatient Medications:    Multiple Vitamin (MULTIVITAMIN) tablet, Take 1 tablet by mouth daily., Disp: , Rfl:    Nutritional Supp - Diet Aids (FAT BURNER THERAPY PO), Take by mouth., Disp: , Rfl:    tirzepatide (MOUNJARO) 2.5 MG/0.5ML Pen, Inject 2.5 mg into the skin once a week., Disp: 2 mL, Rfl: 1   Objective:     Vitals:   10/12/23 1350  BP: 130/82  Pulse: 87  SpO2: 98%  Weight: 222 lb (100.7 kg)  Height: 5\' 2"  (1.575 m)      Body mass index is 40.6 kg/m.    Physical Exam:    General:  awake, alert oriented, no acute distress nontoxic Skin: no suspicious lesions  or rashes Neuro:sensation intact, no deficits, strength 5/5 with no deficits, no atrophy, normal muscle tone Psych: No signs of anxiety, depression or other mood disorder   Bilateral knee: Crepitus present No swelling No deformity Neg fluid wave, joint milking ROM Flex 100, Ext 10 Right TTP medial and lateral joint line, inferior patella, medial femoral condyle NTTP over the quad tendon,   lat fem condyle, patella, plica, patella tendon, tibial tuberostiy, fibular head, posterior fossa, pes anserine bursa, gerdy's tubercle,  Neg anterior and posterior drawer Neg lachman Neg sag sign Negative varus  stress Negative valgus stress Negative McMurray for palpable pop, though reproduced pain on right   Gait normal     Electronically signed by:  Lauren Andersen D.[facility] 2:27 PM 10/12/23

## 2023-10-12 ENCOUNTER — Ambulatory Visit (INDEPENDENT_AMBULATORY_CARE_PROVIDER_SITE_OTHER): Payer: No Typology Code available for payment source | Admitting: Sports Medicine

## 2023-10-12 VITALS — BP 130/82 | HR 87 | Ht 62.0 in | Wt 222.0 lb

## 2023-10-12 DIAGNOSIS — G8929 Other chronic pain: Secondary | ICD-10-CM | POA: Diagnosis not present

## 2023-10-12 DIAGNOSIS — M1711 Unilateral primary osteoarthritis, right knee: Secondary | ICD-10-CM | POA: Diagnosis not present

## 2023-10-12 DIAGNOSIS — M25562 Pain in left knee: Secondary | ICD-10-CM | POA: Diagnosis not present

## 2023-10-12 DIAGNOSIS — M25561 Pain in right knee: Secondary | ICD-10-CM

## 2023-10-12 NOTE — Patient Instructions (Signed)
 PT referral  4-8 week follow up

## 2023-10-23 ENCOUNTER — Other Ambulatory Visit: Payer: Self-pay

## 2023-10-23 ENCOUNTER — Ambulatory Visit: Attending: Sports Medicine

## 2023-10-23 DIAGNOSIS — M25562 Pain in left knee: Secondary | ICD-10-CM | POA: Diagnosis present

## 2023-10-23 DIAGNOSIS — G8929 Other chronic pain: Secondary | ICD-10-CM | POA: Insufficient documentation

## 2023-10-23 DIAGNOSIS — M25561 Pain in right knee: Secondary | ICD-10-CM | POA: Diagnosis present

## 2023-10-23 DIAGNOSIS — M1711 Unilateral primary osteoarthritis, right knee: Secondary | ICD-10-CM | POA: Insufficient documentation

## 2023-10-23 NOTE — Therapy (Signed)
 OUTPATIENT PHYSICAL THERAPY LOWER EXTREMITY EVALUATION   Patient Name: Lauren Andersen MRN: [id number] DOB:Oct 31, 1970, 53 y.o., female Today's Date: 10/23/2023  END OF SESSION:  PT End of Session - 10/23/23 1157     Visit Number 1    Number of Visits 17    Date for PT Re-Evaluation 12/18/23    Authorization Type [employer]    PT Start Time 1103    PT Stop Time 1145    PT Time Calculation (min) 42 min    Activity Tolerance Patient tolerated treatment well    Behavior During Therapy [facility] for tasks assessed/performed             Past Medical History:  Diagnosis Date   Abdominal pain    Arthritis Dec 2023   Fatty liver 08/04/2010   Headache(784.0)    Hepatic steatosis    Past Surgical History:  Procedure Laterality Date   bladder mesh  2008   BLADDER SURGERY     CESAREAN SECTION     x2   colon mesh     RECTAL PROLAPSE REPAIR  2008   TUBAL LIGATION     Patient Active Problem List   Diagnosis Date Noted   Type 2 diabetes mellitus with morbid obesity (HCC) 09/25/2023   Post-menopausal 09/17/2023   Gastroesophageal reflux disease 09/17/2023   Chronic pain of right knee 03/26/2022   History of prediabetes 01/29/2022   Severe obesity (BMI >= 40) (HCC) 01/29/2022   History of cyst of breast 01/29/2022   RUQ abdominal pain 01/29/2022   Screen for colon cancer 01/29/2022   Dyspareunia, female 10/08/2020   Fibroids 10/08/2020   High-tone pelvic floor dysfunction 10/08/2020   Pelvic pressure in female 10/08/2020   PVD (peripheral vascular disease) (HCC) 07/23/2020   Pain of both breasts 08/22/2014   Pap smear for cervical cancer screening 08/22/2014   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) 08/22/2014   Fatty liver 08/22/2014    PCP: Avanell Shackleton, NP-C  REFERRING PROVIDER: Richardean Sale, DO  REFERRING DIAG:  M17.11 (ICD-10-CM) - Primary osteoarthritis of right knee M25.561,G89.29 (ICD-10-CM) - Chronic pain of right  knee M25.562,G89.29 (ICD-10-CM) - Chronic pain of left knee  THERAPY DIAG:  Primary osteoarthritis of right knee - Plan: PT plan of care cert/re-cert  Chronic pain of right knee - Plan: PT plan of care cert/re-cert  Chronic pain of left knee - Plan: PT plan of care cert/re-cert  Rationale for Evaluation and Treatment: Rehabilitation  ONSET DATE: Chronic  SUBJECTIVE:   SUBJECTIVE STATEMENT: Assistance with Video Interpreter:   Pt presents to PT with reports of chronic bilateral knee pain, R>L. Denies trauma or MOI, notes pain has been gradually increasing for 2 years. Most pain in R medial knee, stairs and prolonged walking greatly increase pain. She has had to stop exercising as much due to increases in pain. Denies N/T or other red flag times. Feels her R knee will occasionally buckle.   PERTINENT HISTORY: DMII  PAIN:  Are you having pain?  Yes: NPRS scale: 3/10 Worst: 8/10 Pain location: bilateral knee R>L Pain description: sharp Aggravating factors: prolonged walking, stairs, transfers Relieving factors: rest, ice  PRECAUTIONS: None  RED FLAGS: None   WEIGHT BEARING RESTRICTIONS: No  FALLS:  Has patient fallen in last 6 months? No  LIVING ENVIRONMENT: Lives with: lives with their family Lives in: House/apartment  OCCUPATION: Not currently working  PLOF: Independent  PATIENT GOALS: decrease knee pain, be able to be  active with walking and exercise  NEXT MD VISIT: 11/09/2023   OBJECTIVE:  Note: Objective measures were completed at Evaluation unless otherwise noted.  DIAGNOSTIC FINDINGS:  See imaging   PATIENT SURVEYS:  LEFS: 16/80  COGNITION: Overall cognitive status: Within functional limits for tasks assessed     SENSATION: WFL  POSTURE: rounded shoulders, forward head, and large body habitus  PALPATION: TTP to R patellar hypomobility, distal R quad   LOWER EXTREMITY ROM:  Active ROM Right eval Left eval  Hip flexion    Hip extension     Hip abduction    Hip adduction    Hip internal rotation    Hip external rotation    Knee flexion 120 125  Knee extension 0 0  Ankle dorsiflexion    Ankle plantarflexion    Ankle inversion    Ankle eversion     (Blank rows = not tested)  LOWER EXTREMITY MMT:  MMT Right eval Left eval  Hip flexion 3+/5 4/5  Hip extension    Hip abduction 3+/5 3+/5  Hip adduction    Hip internal rotation    Hip external rotation    Knee flexion 5/5 5/5  Knee extension 5/5 5/5  Ankle dorsiflexion    Ankle plantarflexion    Ankle inversion    Ankle eversion     (Blank rows = not tested)  LOWER EXTREMITY SPECIAL TESTS:  Knee special tests: Lachman Test: negative  FUNCTIONAL TESTS:  30 Second Sit to Stand: 8 reps with UE  GAIT: Distance walked: 55ft Assistive device utilized: None Level of assistance: Complete Independence Comments: antalgic gait R    TREATMENT: [facility] Adult PT Treatment:                                                DATE: 10/23/2023 Therapeutic Exercise: Supine QS x 5 - 5" hold Supine SLR x 5 each S/L hip abd x 5 each S/L clamshell x 5 GTB  PATIENT EDUCATION:  Education details: eval findings, LEFS, HEP, POC Person educated: Patient Education method: Explanation, Demonstration, and Handouts Education comprehension: verbalized understanding and returned demonstration  HOME EXERCISE PROGRAM: Access Code: [email or web address] URL: [email or web address] Date: 10/23/2023 Prepared by: Edwinna Areola  Exercises - Supine Quadricep Sets  - 1 x daily - 7 x weekly - 2 sets - 10 reps - 5 sec hold - Active Straight Leg Raise with Quad Set  - 1 x daily - 7 x weekly - 3 sets - 15 reps - Sidelying Hip Abduction  - 1 x daily - 7 x weekly - 3 sets - 10 reps - Clamshell with Resistance  - 1 x daily - 7 x weekly - 3 sets - 15 reps - green band hold  ASSESSMENT:  CLINICAL IMPRESSION: Patient is a 53 y.o. F who was seen today for physical therapy evaluation and treatment  for chronic bilateral knee pain and discomfort. Physical findings are consistnet with physicain impression as pt demonstrates decrease in quad and proximal hip strength as well as decrease in functional mobility. Pt LEFS score shows moderate disability in performance of home ADLs and higher level community activities. Pt would benefit from skilled PT services working on improving strength and functional mobility in order to decrease pain.    OBJECTIVE IMPAIRMENTS: decreased activity tolerance, decreased mobility, difficulty walking, decreased strength, and pain  ACTIVITY LIMITATIONS: sitting,  standing, squatting, stairs, transfers, and locomotion level  PARTICIPATION LIMITATIONS: driving, shopping, community activity, occupation, and yard work  PERSONAL FACTORS: Time since onset of injury/illness/exacerbation are also affecting patient's functional outcome.   REHAB POTENTIAL: Good  CLINICAL DECISION MAKING: Stable/uncomplicated  EVALUATION COMPLEXITY: Low   GOALS: Goals reviewed with patient? No  SHORT TERM GOALS: Target date: 11/13/2023   Pt will be compliant and knowledgeable with initial HEP for improved comfort and carryover Baseline: initial HEP given  Goal status: INITIAL  2.  Pt will self report bilateral knee pain no greater than 6/10 for improved comfort and functional ability Baseline: 10/10 at worst Goal status: INITIAL   LONG TERM GOALS: Target date: 12/18/2023   Pt will improve LEFS to no less than 30/80 as proxy for functional improvement with home ADLs and higher level community activity Baseline: 16/80 Goal status: INITIAL   2.  Pt will self report bilateral knee pain no greater than 3/10 for improved comfort and functional ability Baseline: 10/10 at worst Goal status: INITIAL   3.  Pt will increase 30 Second Sit to Stand rep count to no less than 10 reps for improved balance, strength, and functional mobility Baseline: 8 reps with UE Goal status: INITIAL    4.  Pt will improve bilateral LE MMT to no less than 4/5 for all tested motions for improved functional mobility and decrease knee pain Baseline:  Goal status: INITIAL    PLAN:  PT FREQUENCY: 2x/week  PT DURATION: 8 weeks  PLANNED INTERVENTIONS: 97164- PT Re-evaluation, 97110-Therapeutic exercises, 97530- Therapeutic activity, [id number]- Neuromuscular re-education, 97535- Self Care, [id number]- Manual therapy, [id number]- Gait training, A2130- Electrical stimulation (unattended), [id number]- Electrical stimulation (manual), and Dry Needling  PLAN FOR NEXT SESSION: assess HEP response, eccentric quad and proximal hip strengthening  For all possible CPT codes, reference the Planned Interventions line above.     Check all conditions that are expected to impact treatment: {Conditions expected to impact treatment:Diabetes mellitus and Musculoskeletal disorders   If treatment provided at initial evaluation, no treatment charged due to lack of authorization.       Eloy End, PT 10/23/2023, 11:59 AM

## 2023-11-03 ENCOUNTER — Ambulatory Visit: Attending: Sports Medicine

## 2023-11-03 DIAGNOSIS — G8929 Other chronic pain: Secondary | ICD-10-CM | POA: Insufficient documentation

## 2023-11-03 DIAGNOSIS — M25562 Pain in left knee: Secondary | ICD-10-CM | POA: Diagnosis present

## 2023-11-03 DIAGNOSIS — M25561 Pain in right knee: Secondary | ICD-10-CM | POA: Insufficient documentation

## 2023-11-03 DIAGNOSIS — M1711 Unilateral primary osteoarthritis, right knee: Secondary | ICD-10-CM | POA: Diagnosis present

## 2023-11-03 NOTE — Therapy (Signed)
 OUTPATIENT PHYSICAL THERAPY TREATMENT   Patient Name: Lauren Andersen MRN: [id number] DOB:[birth year], 53 y.o., female Today's Date: 11/03/2023  END OF SESSION:  PT End of Session - 11/03/23 0938     Visit Number 2    Number of Visits 17    Date for PT Re-Evaluation 12/18/23    Authorization Type [employer]    PT Start Time 0936    PT Stop Time 1014    PT Time Calculation (min) 38 min    Activity Tolerance Patient tolerated treatment well    Behavior During Therapy [facility] for tasks assessed/performed              Past Medical History:  Diagnosis Date   Abdominal pain    Arthritis Dec 2023   Fatty liver 08/04/2010   Headache(784.0)    Hepatic steatosis    Past Surgical History:  Procedure Laterality Date   bladder mesh  2008   BLADDER SURGERY     CESAREAN SECTION     x2   colon mesh     RECTAL PROLAPSE REPAIR  2008   TUBAL LIGATION     Patient Active Problem List   Diagnosis Date Noted   Type 2 diabetes mellitus with morbid obesity (HCC) 09/25/2023   Post-menopausal 09/17/2023   Gastroesophageal reflux disease 09/17/2023   Chronic pain of right knee 03/26/2022   History of prediabetes 01/29/2022   Severe obesity (BMI >= 40) (HCC) 01/29/2022   History of cyst of breast 01/29/2022   RUQ abdominal pain 01/29/2022   Screen for colon cancer 01/29/2022   Dyspareunia, female 10/08/2020   Fibroids 10/08/2020   High-tone pelvic floor dysfunction 10/08/2020   Pelvic pressure in female 10/08/2020   PVD (peripheral vascular disease) (HCC) 07/23/2020   Pain of both breasts 08/22/2014   Pap smear for cervical cancer screening 08/22/2014   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) 08/22/2014   Fatty liver 08/22/2014    PCP: Avanell Shackleton, NP-C  REFERRING PROVIDER: Richardean Sale, DO  REFERRING DIAG:  M17.11 (ICD-10-CM) - Primary osteoarthritis of right knee M25.561,G89.29 (ICD-10-CM) - Chronic pain of right knee M25.562,G89.29  (ICD-10-CM) - Chronic pain of left knee  THERAPY DIAG:  Primary osteoarthritis of right knee  Chronic pain of right knee  Chronic pain of left knee  Rationale for Evaluation and Treatment: Rehabilitation  ONSET DATE: Chronic  SUBJECTIVE:   SUBJECTIVE STATEMENT: Assistance with In-Person Interpreter:   Pt presents to PT noting decrease in knee pain. Has been compliant with HEP.  EVAL: Pt presents to PT with reports of chronic bilateral knee pain, R>L. Denies trauma or MOI, notes pain has been gradually increasing for 2 years. Most pain in R medial knee, stairs and prolonged walking greatly increase pain. She has had to stop exercising as much due to increases in pain. Denies N/T or other red flag times. Feels her R knee will occasionally buckle.   PERTINENT HISTORY: DMII  PAIN:  Are you having pain?  Yes: NPRS scale: 4/10 Worst: 8/10 Pain location: bilateral knee R>L Pain description: sharp Aggravating factors: prolonged walking, stairs, transfers Relieving factors: rest, ice  PRECAUTIONS: None  RED FLAGS: None   WEIGHT BEARING RESTRICTIONS: No  FALLS:  Has patient fallen in last 6 months? No  LIVING ENVIRONMENT: Lives with: lives with their family Lives in: House/apartment  OCCUPATION: Not currently working  PLOF: Independent  PATIENT GOALS: decrease knee pain, be able to be active with walking and exercise  NEXT MD VISIT: 11/09/2023   OBJECTIVE:  Note: Objective measures were completed at Evaluation unless otherwise noted.  DIAGNOSTIC FINDINGS:  See imaging   PATIENT SURVEYS:  LEFS: 16/80  COGNITION: Overall cognitive status: Within functional limits for tasks assessed     SENSATION: WFL  POSTURE: rounded shoulders, forward head, and large body habitus  PALPATION: TTP to R patellar hypomobility, distal R quad   LOWER EXTREMITY ROM:  Active ROM Right eval Left eval  Hip flexion    Hip extension    Hip abduction    Hip adduction    Hip  internal rotation    Hip external rotation    Knee flexion 120 125  Knee extension 0 0  Ankle dorsiflexion    Ankle plantarflexion    Ankle inversion    Ankle eversion     (Blank rows = not tested)  LOWER EXTREMITY MMT:  MMT Right eval Left eval  Hip flexion 3+/5 4/5  Hip extension    Hip abduction 3+/5 3+/5  Hip adduction    Hip internal rotation    Hip external rotation    Knee flexion 5/5 5/5  Knee extension 5/5 5/5  Ankle dorsiflexion    Ankle plantarflexion    Ankle inversion    Ankle eversion     (Blank rows = not tested)  LOWER EXTREMITY SPECIAL TESTS:  Knee special tests: Lachman Test: negative  FUNCTIONAL TESTS:  30 Second Sit to Stand: 8 reps with UE  GAIT: Distance walked: 59ft Assistive device utilized: None Level of assistance: Complete Independence Comments: antalgic gait R    TREATMENT: [facility] Adult PT Treatment:                                                DATE: 11/03/2023 Therapeutic Exercise: McConnell tape to R knee Supine QS x 10 - 5" hold Supine SLR 2x10 2# each S/L hip abd 2x10 2# each Lateral walk YTB at counter x 3 laps Slant board calf stretch 2x30" LAQ 2x10 4# each Therapeutic Activity: NuStep lvl 5 LE only x 3 min for functional activity tolerance Step ups 2x10 fwd 8in each Functional squat 2x10 - B UE STS 2x10 - low table no UE  [facility] Adult PT Treatment:                                                DATE: 10/23/2023 Therapeutic Exercise: Supine QS x 5 - 5" hold Supine SLR x 5 each S/L hip abd x 5 each S/L clamshell x 5 GTB  PATIENT EDUCATION:  Education details: eval findings, LEFS, HEP, POC Person educated: Patient Education method: Explanation, Demonstration, and Handouts Education comprehension: verbalized understanding and returned demonstration  HOME EXERCISE PROGRAM: Access Code: [email or web address] URL: [email or web address] Date: 11/03/2023 Prepared by: Edwinna Areola  Exercises - Supine Quadricep Sets  -  1 x daily - 7 x weekly - 2 sets - 10 reps - 5 sec hold - Active Straight Leg Raise with Quad Set  - 1 x daily - 7 x weekly - 3 sets - 15 reps - Sidelying Hip Abduction  - 1 x daily - 7 x weekly - 3 sets - 10 reps - Clamshell with Resistance  - 1  x daily - 7 x weekly - 3 sets - 15 reps - green band hold - Side Stepping with Resistance at Ankles and Counter Support  - 1 x daily - 7 x weekly - 3 reps - yellow band hold - Sit to Stand Without Arm Support  - 1 x daily - 7 x weekly - 2 sets - 10 reps  ASSESSMENT:  CLINICAL IMPRESSION: Pt was able to complete all prescribed exercises with no adverse effect. Exercises today focused on improving quad and lateral hip strength as well as improving mobility with functional activities. HEP updated for continued LE strengthening. Pt is progressing with therapy, will continue to progress as able per POC.   EVAL: Patient is a 53 y.o. F who was seen today for physical therapy evaluation and treatment for chronic bilateral knee pain and discomfort. Physical findings are consistnet with physicain impression as pt demonstrates decrease in quad and proximal hip strength as well as decrease in functional mobility. Pt LEFS score shows moderate disability in performance of home ADLs and higher level community activities. Pt would benefit from skilled PT services working on improving strength and functional mobility in order to decrease pain.    OBJECTIVE IMPAIRMENTS: decreased activity tolerance, decreased mobility, difficulty walking, decreased strength, and pain  ACTIVITY LIMITATIONS: sitting, standing, squatting, stairs, transfers, and locomotion level  PARTICIPATION LIMITATIONS: driving, shopping, community activity, occupation, and yard work  PERSONAL FACTORS: Time since onset of injury/illness/exacerbation are also affecting patient's functional outcome.   REHAB POTENTIAL: Good  CLINICAL DECISION MAKING: Stable/uncomplicated  EVALUATION COMPLEXITY:  Low   GOALS: Goals reviewed with patient? No  SHORT TERM GOALS: Target date: 11/13/2023   Pt will be compliant and knowledgeable with initial HEP for improved comfort and carryover Baseline: initial HEP given  Goal status: INITIAL  2.  Pt will self report bilateral knee pain no greater than 6/10 for improved comfort and functional ability Baseline: 10/10 at worst Goal status: INITIAL   LONG TERM GOALS: Target date: 12/18/2023   Pt will improve LEFS to no less than 30/80 as proxy for functional improvement with home ADLs and higher level community activity Baseline: 16/80 Goal status: INITIAL   2.  Pt will self report bilateral knee pain no greater than 3/10 for improved comfort and functional ability Baseline: 10/10 at worst Goal status: INITIAL   3.  Pt will increase 30 Second Sit to Stand rep count to no less than 10 reps for improved balance, strength, and functional mobility Baseline: 8 reps with UE Goal status: INITIAL   4.  Pt will improve bilateral LE MMT to no less than 4/5 for all tested motions for improved functional mobility and decrease knee pain Baseline:  Goal status: INITIAL    PLAN:  PT FREQUENCY: 2x/week  PT DURATION: 8 weeks  PLANNED INTERVENTIONS: 97164- PT Re-evaluation, 97110-Therapeutic exercises, 97530- Therapeutic activity, [id number]- Neuromuscular re-education, 97535- Self Care, 16109- Manual therapy, [id number]- Gait training, U0454- Electrical stimulation (unattended), [id number]- Electrical stimulation (manual), and Dry Needling  PLAN FOR NEXT SESSION: assess HEP response, eccentric quad and proximal hip strengthening  For all possible CPT codes, reference the Planned Interventions line above.     Check all conditions that are expected to impact treatment: {Conditions expected to impact treatment:Diabetes mellitus and Musculoskeletal disorders   If treatment provided at initial evaluation, no treatment charged due to lack of authorization.       Eloy End, PT 11/03/2023, 10:15 AM

## 2023-11-06 ENCOUNTER — Ambulatory Visit: Admitting: Physical Therapy

## 2023-11-06 ENCOUNTER — Encounter: Payer: Self-pay | Admitting: Physical Therapy

## 2023-11-06 DIAGNOSIS — M1711 Unilateral primary osteoarthritis, right knee: Secondary | ICD-10-CM | POA: Diagnosis not present

## 2023-11-06 DIAGNOSIS — G8929 Other chronic pain: Secondary | ICD-10-CM

## 2023-11-06 NOTE — Therapy (Signed)
 OUTPATIENT PHYSICAL THERAPY TREATMENT   Patient Name: Lauren Andersen MRN: [id number] DOB:19-Aug-1970, 53 y.o., female Today's Date: 11/06/2023  END OF SESSION:  PT End of Session - 11/06/23 0731     Visit Number 3    Number of Visits 17    Date for PT Re-Evaluation 12/18/23    Authorization Type [employer]    PT Start Time 0730    PT Stop Time 0808    PT Time Calculation (min) 38 min              Past Medical History:  Diagnosis Date   Abdominal pain    Arthritis Dec 2023   Fatty liver 08/04/2010   Headache(784.0)    Hepatic steatosis    Past Surgical History:  Procedure Laterality Date   bladder mesh  2008   BLADDER SURGERY     CESAREAN SECTION     x2   colon mesh     RECTAL PROLAPSE REPAIR  2008   TUBAL LIGATION     Patient Active Problem List   Diagnosis Date Noted   Type 2 diabetes mellitus with morbid obesity (HCC) 09/25/2023   Post-menopausal 09/17/2023   Gastroesophageal reflux disease 09/17/2023   Chronic pain of right knee 03/26/2022   History of prediabetes 01/29/2022   Severe obesity (BMI >= 40) (HCC) 01/29/2022   History of cyst of breast 01/29/2022   RUQ abdominal pain 01/29/2022   Screen for colon cancer 01/29/2022   Dyspareunia, female 10/08/2020   Fibroids 10/08/2020   High-tone pelvic floor dysfunction 10/08/2020   Pelvic pressure in female 10/08/2020   PVD (peripheral vascular disease) (HCC) 07/23/2020   Pain of both breasts 08/22/2014   Pap smear for cervical cancer screening 08/22/2014   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  Declines video interpreter. Has pain with prolonged sitting or standing.   EVAL: Pt presents to PT with reports of chronic bilateral knee pain, R>L. Denies trauma or MOI, notes pain has been gradually increasing for 2 years. Most pain in R medial knee, stairs and prolonged walking greatly increase pain. She has had to stop exercising as much due to increases in pain. Denies N/T or other red flag times. Feels her R knee will occasionally buckle.   PERTINENT HISTORY: DMII  PAIN:  Are you having pain?  Yes: NPRS scale: 4/10 Worst: 8/10 Pain location: bilateral knee R>L Pain description: sharp Aggravating factors: prolonged walking, stairs, transfers Relieving factors: rest, ice  PRECAUTIONS: None  RED FLAGS: None   WEIGHT BEARING RESTRICTIONS: No  FALLS:  Has patient fallen in last 6 months? No  LIVING ENVIRONMENT: Lives with: lives with their family Lives in: House/apartment  OCCUPATION: Not currently working  PLOF: Independent  PATIENT GOALS: decrease knee pain, be able to be active with walking and exercise  NEXT MD VISIT: 11/09/2023   OBJECTIVE:  Note: Objective measures were completed at Evaluation unless otherwise noted.  DIAGNOSTIC FINDINGS:  See imaging   PATIENT SURVEYS:  LEFS: 16/80  COGNITION: Overall cognitive status: Within functional limits for tasks assessed     SENSATION: WFL  POSTURE: rounded shoulders, forward head, and large body habitus  PALPATION: TTP to R patellar hypomobility, distal R quad   LOWER EXTREMITY ROM:  Active ROM Right eval Left eval  Hip flexion    Hip extension    Hip abduction    Hip adduction    Hip internal rotation    Hip external rotation    Knee flexion 120 125  Knee extension 0 0  Ankle dorsiflexion    Ankle  plantarflexion    Ankle inversion    Ankle eversion     (Blank rows = not tested)  LOWER EXTREMITY MMT:  MMT Right eval Left eval  Hip flexion 3+/5 4/5  Hip extension    Hip abduction 3+/5 3+/5  Hip adduction    Hip internal rotation    Hip external rotation    Knee flexion 5/5 5/5  Knee extension 5/5 5/5  Ankle dorsiflexion    Ankle plantarflexion    Ankle inversion    Ankle eversion     (Blank rows = not tested)  LOWER EXTREMITY SPECIAL TESTS:  Knee special tests: Lachman Test: negative  FUNCTIONAL TESTS:  30 Second Sit to Stand: 8 reps with UE  GAIT: Distance walked: 84ft Assistive device utilized: None Level of assistance: Complete Independence Comments: antalgic gait R    TREATMENT: [facility] Adult PT Treatment:                                                DATE: 11/06/23 Mcconnel tape to right knee SAQ 10 x 2 bilateral  QS x 10 right SLR 10 x 2 each , 1 set with 2#  Bridge 10 x 2  LAQ 5# 10 x 2 each , removed Right weight due to knee pain STS 10 x 2  8 inch step up 10 x 2 each  Lateral walk YTB at counter x 3 laps Hip abdct 10 x 2 each in sidelying     [facility] Adult PT Treatment:                                                DATE: 11/03/2023 Therapeutic Exercise: McConnell tape to R knee Supine QS x 10 - 5" hold Supine SLR 2x10 2# each S/L hip abd 2x10 2# each Lateral walk YTB at counter x 3 laps Slant board calf stretch 2x30" LAQ 2x10 4# each Therapeutic Activity: NuStep lvl 5 LE only x 3 min for functional activity tolerance Step ups 2x10 fwd 8in each Functional squat 2x10 - B UE STS 2x10 - low table no UE  [facility] Adult PT Treatment:                                                DATE: 10/23/2023 Therapeutic Exercise: Supine QS x 5 - 5" hold Supine SLR x 5 each S/L hip abd x 5 each S/L clamshell x 5 GTB  PATIENT EDUCATION:  Education details: eval findings, LEFS, HEP, POC Person educated: Patient Education method: Explanation, Demonstration, and  Handouts Education comprehension: verbalized understanding and returned demonstration  HOME EXERCISE PROGRAM: Access Code: [email or web address] URL: [email or web address] Date: 11/03/2023 Prepared by: Edwinna Areola  Exercises - Supine Quadricep Sets  - 1 x daily - 7 x weekly - 2 sets - 10 reps - 5 sec hold - Active Straight Leg Raise with Quad Set  - 1 x daily - 7 x weekly - 3 sets - 15 reps - Sidelying Hip Abduction  - 1 x daily - 7 x weekly - 3 sets - 10 reps - Clamshell with Resistance  - 1 x daily - 7 x weekly - 3 sets - 15 reps - green band hold - Side Stepping with Resistance at Ankles and Counter Support  - 1 x daily - 7 x weekly - 3 reps - yellow band hold - Sit to Stand Without Arm Support  - 1 x daily - 7 x weekly - 2 sets - 10 reps  ASSESSMENT:  CLINICAL IMPRESSION: Pt was able to complete all prescribed exercises with no adverse effect. Pt unsure if tape was helpful last session. Reapplied with medial pull to patella. Exercises today focused on improving quad and lateral hip strength as well as improving mobility with functional activities. No changes to HEP. Pt is progressing with therapy, will continue to progress as able per POC.   EVAL: Patient is a 53 y.o. F who was seen today for physical therapy evaluation and treatment for chronic bilateral knee pain and discomfort. Physical findings are consistnet with physicain impression as pt demonstrates decrease in quad and proximal hip strength as well as decrease in functional mobility. Pt LEFS score shows moderate disability in performance of home ADLs and higher level community activities. Pt would benefit from skilled PT services working on improving strength and functional mobility in order to decrease pain.    OBJECTIVE IMPAIRMENTS: decreased activity tolerance, decreased mobility, difficulty walking, decreased strength, and pain  ACTIVITY LIMITATIONS: sitting, standing, squatting, stairs, transfers, and locomotion  level  PARTICIPATION LIMITATIONS: driving, shopping, community activity, occupation, and yard work  PERSONAL FACTORS: Time since onset of injury/illness/exacerbation are also affecting patient's functional outcome.   REHAB POTENTIAL: Good  CLINICAL DECISION MAKING: Stable/uncomplicated  EVALUATION COMPLEXITY: Low   GOALS: Goals reviewed with patient? No  SHORT TERM GOALS: Target date: 11/13/2023   Pt will be compliant and knowledgeable with initial HEP for improved comfort and carryover Baseline: initial HEP given  Goal status: INITIAL  2.  Pt will self report bilateral knee pain no greater than 6/10 for improved comfort and functional ability Baseline: 10/10 at worst Goal status: INITIAL   LONG TERM GOALS: Target date: 12/18/2023   Pt will improve LEFS to no less than 30/80 as proxy for functional improvement with home ADLs and higher level community activity Baseline: 16/80 Goal status: INITIAL   2.  Pt will self report bilateral knee pain no greater than 3/10 for improved comfort and functional ability Baseline: 10/10 at worst Goal status: INITIAL   3.  Pt will increase 30 Second Sit to Stand rep count to no less than 10 reps for improved balance, strength, and functional mobility Baseline: 8 reps with UE Goal status: INITIAL   4.  Pt will improve bilateral LE MMT to no less than 4/5 for all tested motions for improved functional mobility and decrease knee pain Baseline:  Goal status: INITIAL    PLAN:  PT FREQUENCY:  2x/week  PT DURATION: 8 weeks  PLANNED INTERVENTIONS: 97164- PT Re-evaluation, 97110-Therapeutic exercises, 97530- Therapeutic activity, [id number]- Neuromuscular re-education, 97535- Self Care, 62952- Manual therapy, [id number]- Gait training, [phone]- Electrical stimulation (unattended), [phone]- Electrical stimulation (manual), and Dry Needling  PLAN FOR NEXT SESSION: assess HEP response, eccentric quad and proximal hip strengthening  For all possible CPT  codes, reference the Planned Interventions line above.     Check all conditions that are expected to impact treatment: {Conditions expected to impact treatment:Diabetes mellitus and Musculoskeletal disorders   If treatment provided at initial evaluation, no treatment charged due to lack of authorization.       Jannette Spanner, PTA 11/06/23 8:16 AM Phone: [phone] Fax: [phone]

## 2023-11-09 ENCOUNTER — Ambulatory Visit: Admitting: Sports Medicine

## 2023-11-10 ENCOUNTER — Ambulatory Visit

## 2023-11-10 DIAGNOSIS — M1711 Unilateral primary osteoarthritis, right knee: Secondary | ICD-10-CM | POA: Diagnosis not present

## 2023-11-10 DIAGNOSIS — G8929 Other chronic pain: Secondary | ICD-10-CM

## 2023-11-10 NOTE — Therapy (Signed)
 OUTPATIENT PHYSICAL THERAPY TREATMENT   Patient Name: Lauren Andersen MRN: [id number] DOB:1971-03-27, 53 y.o., female Today's Date: 11/10/2023  END OF SESSION:  PT End of Session - 11/10/23 0940     Visit Number 4    Number of Visits 17    Date for PT Re-Evaluation 12/18/23    Authorization Type [employer]    PT Start Time 0938    PT Stop Time 1016    PT Time Calculation (min) 38 min               Past Medical History:  Diagnosis Date   Abdominal pain    Arthritis Dec 2023   Fatty liver 08/04/2010   Headache(784.0)    Hepatic steatosis    Past Surgical History:  Procedure Laterality Date   bladder mesh  2008   BLADDER SURGERY     CESAREAN SECTION     x2   colon mesh     RECTAL PROLAPSE REPAIR  2008   TUBAL LIGATION     Patient Active Problem List   Diagnosis Date Noted   Type 2 diabetes mellitus with morbid obesity (HCC) 09/25/2023   Post-menopausal 09/17/2023   Gastroesophageal reflux disease 09/17/2023   Chronic pain of right knee 03/26/2022   History of prediabetes 01/29/2022   Severe obesity (BMI >= 40) (HCC) 01/29/2022   History of cyst of breast 01/29/2022   RUQ abdominal pain 01/29/2022   Screen for colon cancer 01/29/2022   Dyspareunia, female 10/08/2020   Fibroids 10/08/2020   High-tone pelvic floor dysfunction 10/08/2020   Pelvic pressure in female 10/08/2020   PVD (peripheral vascular disease) (HCC) 07/23/2020   Pain of both breasts 08/22/2014   Pap smear for cervical cancer screening 08/22/2014   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 She was on her feet for 7 hours. Has been compliant with HEP.   EVAL: Pt presents to PT with reports of chronic bilateral knee pain, R>L. Denies trauma or MOI, notes pain has been gradually increasing for 2 years. Most pain in R medial knee, stairs and prolonged walking greatly increase pain. She has had to stop exercising as much due to increases in pain. Denies N/T or other red flag times. Feels her R knee will occasionally buckle.   PERTINENT HISTORY: DMII  PAIN:  Are you having pain?  Yes: NPRS scale: 4/10 Worst: 8/10 Pain location: bilateral knee R>L Pain description: sharp Aggravating factors: prolonged walking, stairs, transfers Relieving factors: rest, ice  PRECAUTIONS: None  RED FLAGS: None   WEIGHT BEARING RESTRICTIONS: No  FALLS:  Has patient fallen in last 6 months? No  LIVING ENVIRONMENT: Lives with: lives with their family Lives in: House/apartment  OCCUPATION: Not currently working  PLOF: Independent  PATIENT GOALS: decrease knee pain, be able to be active with walking and exercise  NEXT MD VISIT: 11/09/2023   OBJECTIVE:  Note: Objective measures were completed at Evaluation unless otherwise noted.  DIAGNOSTIC FINDINGS:  See imaging   PATIENT SURVEYS:  LEFS: 16/80  COGNITION: Overall cognitive status: Within functional limits for tasks assessed     SENSATION: WFL  POSTURE: rounded shoulders, forward head, and large body habitus  PALPATION: TTP to R patellar hypomobility, distal R quad   LOWER EXTREMITY ROM:  Active ROM Right eval Left eval  Hip flexion    Hip extension    Hip abduction    Hip adduction    Hip internal rotation    Hip external rotation    Knee flexion 120 125  Knee extension 0 0  Ankle dorsiflexion    Ankle  plantarflexion    Ankle inversion    Ankle eversion     (Blank rows = not tested)  LOWER EXTREMITY MMT:  MMT Right eval Left eval  Hip flexion 3+/5 4/5  Hip extension    Hip abduction 3+/5 3+/5  Hip adduction    Hip internal rotation    Hip external rotation    Knee flexion 5/5 5/5  Knee extension 5/5 5/5  Ankle dorsiflexion    Ankle plantarflexion    Ankle inversion    Ankle eversion     (Blank rows = not tested)  LOWER EXTREMITY SPECIAL TESTS:  Knee special tests: Lachman Test: negative  FUNCTIONAL TESTS:  30 Second Sit to Stand: 8 reps with UE  GAIT: Distance walked: 58ft Assistive device utilized: None Level of assistance: Complete Independence Comments: antalgic gait R    TREATMENT: [facility] Adult PT Treatment:                                                DATE: 11/10/23 Therapeutic Exercise:  Supine OS x 10 - 5" hold Supine SLR 2x15 each McConnell tape with lateral glide bilaterally SAQ 2x10 2# S/L hip abd 2x15 Bridge with GTB 2x15  S/L clamshell 2x15 GTB Lateral walk YTB x 3 laps at counter Standing hip abd 2x10 YTB Standing TKE with ball x 10 - 5" hold R Therapeutic Activity: STS 2x10 - low table no UE Step up 2x10 fwd R stance 8in - no UE Functional squat  - 2x10 - B UE  [facility] Adult PT Treatment:                                                DATE: 11/06/23 Mcconnel tape to right knee SAQ 10 x 2 bilateral  QS x 10 right SLR 10 x 2 each , 1 set with 2#  Bridge 10 x 2  LAQ 5# 10 x 2 each , removed Right weight due to knee pain STS 10 x 2  8 inch step up 10 x 2 each Lateral walk YTB at counter x 3 laps Hip abdct 10 x 2 each in sidelying     [facility] Adult PT Treatment:                                                DATE: 11/03/2023 Therapeutic Exercise: McConnell tape to R knee Supine QS x 10 - 5" hold Supine SLR 2x10 2# each  S/L hip abd 2x10 2# each Lateral walk YTB at counter x 3 laps Slant board calf stretch 2x30" LAQ 2x10 4# each Therapeutic  Activity: NuStep lvl 5 LE only x 3 min for functional activity tolerance Step ups 2x10 fwd 8in each Functional squat 2x10 - B UE STS 2x10 - low table no UE  PATIENT EDUCATION:  Education details: HEP Person educated: Patient Education method: Explanation, Demonstration, and Handouts Education comprehension: verbalized understanding and returned demonstration  HOME EXERCISE PROGRAM: Access Code: [email or web address] URL: [email or web address] Date: 11/03/2023 Prepared by: Edwinna Areola  Exercises - Supine Quadricep Sets  - 1 x daily - 7 x weekly - 2 sets - 10 reps - 5 sec hold - Active Straight Leg Raise with Quad Set  - 1 x daily - 7 x weekly - 3 sets - 15 reps - Sidelying Hip Abduction  - 1 x daily - 7 x weekly - 3 sets - 10 reps - Clamshell with Resistance  - 1 x daily - 7 x weekly - 3 sets - 15 reps - green band hold - Side Stepping with Resistance at Ankles and Counter Support  - 1 x daily - 7 x weekly - 3 reps - yellow band hold - Sit to Stand Without Arm Support  - 1 x daily - 7 x weekly - 2 sets - 10 reps  ASSESSMENT:  CLINICAL IMPRESSION: Pt was able to complete all prescribed exercises with no adverse effect. Therapy today continued to focus particularly on quad and lateral hip strengthening in order to decrease knee pain and improve mobility. Pt progressing with therapy, will continue per POC.   EVAL: Patient is a 53 y.o. F who was seen today for physical therapy evaluation and treatment for chronic bilateral knee pain and discomfort. Physical findings are consistnet with physicain impression as pt demonstrates decrease in quad and proximal hip strength as well as decrease in functional mobility. Pt LEFS score shows moderate disability in performance of home ADLs and higher level community activities. Pt would benefit from skilled PT services working on improving strength and functional mobility in order to decrease pain.    OBJECTIVE IMPAIRMENTS: decreased activity  tolerance, decreased mobility, difficulty walking, decreased strength, and pain  ACTIVITY LIMITATIONS: sitting, standing, squatting, stairs, transfers, and locomotion level  PARTICIPATION LIMITATIONS: driving, shopping, community activity, occupation, and yard work  PERSONAL FACTORS: Time since onset of injury/illness/exacerbation are also affecting patient's functional outcome.   REHAB POTENTIAL: Good  CLINICAL DECISION MAKING: Stable/uncomplicated  EVALUATION COMPLEXITY: Low   GOALS: Goals reviewed with patient? No  SHORT TERM GOALS: Target date: 11/13/2023   Pt will be compliant and knowledgeable with initial HEP for improved comfort and carryover Baseline: initial HEP given  Goal status: INITIAL  2.  Pt will self report bilateral knee pain no greater than 6/10 for improved comfort and functional ability Baseline: 10/10 at worst Goal status: INITIAL   LONG TERM GOALS: Target date: 12/18/2023   Pt will improve LEFS to no less than 30/80 as proxy for functional improvement with home ADLs and higher level community activity Baseline: 16/80 Goal status: INITIAL   2.  Pt will self report bilateral knee pain no greater than 3/10 for improved comfort and functional ability Baseline: 10/10 at worst Goal status: INITIAL   3.  Pt will increase 30 Second Sit to Stand rep count to no less than 10 reps for improved balance, strength, and functional mobility Baseline: 8 reps with UE Goal status: INITIAL   4.  Pt will improve bilateral LE MMT to no less than 4/5 for all tested motions for improved functional mobility and decrease knee pain Baseline:  Goal status: INITIAL    PLAN:  PT FREQUENCY: 2x/week  PT DURATION: 8 weeks  PLANNED INTERVENTIONS: 97164- PT Re-evaluation, 97110-Therapeutic exercises, 97530- Therapeutic activity, [id number]- Neuromuscular re-education, 97535- Self Care, 91478- Manual therapy, [id number]- Gait training, G9562- Electrical stimulation (unattended), [id number]-  Electrical stimulation (manual), and Dry Needling  PLAN FOR NEXT SESSION: assess HEP response, eccentric quad and proximal hip strengthening  For all possible CPT codes, reference the Planned Interventions line above.     Check all conditions that are expected to impact treatment: {Conditions expected to impact treatment:Diabetes mellitus and Musculoskeletal disorders   If treatment provided at initial evaluation, no treatment charged due to lack of authorization.       Eloy End PT  11/10/23 10:19 AM

## 2023-11-13 ENCOUNTER — Ambulatory Visit: Payer: No Typology Code available for payment source | Admitting: Family Medicine

## 2023-11-13 ENCOUNTER — Encounter: Payer: Self-pay | Admitting: Physical Therapy

## 2023-11-13 ENCOUNTER — Ambulatory Visit: Admitting: Physical Therapy

## 2023-11-13 DIAGNOSIS — M1711 Unilateral primary osteoarthritis, right knee: Secondary | ICD-10-CM

## 2023-11-13 DIAGNOSIS — G8929 Other chronic pain: Secondary | ICD-10-CM

## 2023-11-13 NOTE — Therapy (Signed)
 OUTPATIENT PHYSICAL THERAPY TREATMENT   Patient Name: Lauren Andersen MRN: [id number] DOB:1971-01-04, 53 y.o., female Today's Date: 11/13/2023  END OF SESSION:  PT End of Session - 11/13/23 0854     Visit Number 5    Number of Visits 17    Date for PT Re-Evaluation 12/18/23    Authorization Type [employer]    PT Start Time 0850    PT Stop Time 0929    PT Time Calculation (min) 39 min               Past Medical History:  Diagnosis Date   Abdominal pain    Arthritis Dec 2023   Fatty liver 08/04/2010   Headache(784.0)    Hepatic steatosis    Past Surgical History:  Procedure Laterality Date   bladder mesh  2008   BLADDER SURGERY     CESAREAN SECTION     x2   colon mesh     RECTAL PROLAPSE REPAIR  2008   TUBAL LIGATION     Patient Active Problem List   Diagnosis Date Noted   Type 2 diabetes mellitus with morbid obesity (HCC) 09/25/2023   Post-menopausal 09/17/2023   Gastroesophageal reflux disease 09/17/2023   Chronic pain of right knee 03/26/2022   History of prediabetes 01/29/2022   Severe obesity (BMI >= 40) (HCC) 01/29/2022   History of cyst of breast 01/29/2022   RUQ abdominal pain 01/29/2022   Screen for colon cancer 01/29/2022   Dyspareunia, female 10/08/2020   Fibroids 10/08/2020   High-tone pelvic floor dysfunction 10/08/2020   Pelvic pressure in female 10/08/2020   PVD (peripheral vascular disease) (HCC) 07/23/2020   Pain of both breasts 08/22/2014   Pap smear for cervical cancer screening 08/22/2014   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 She plans to ask MD for an injection for her right knee.   EVAL: Pt presents to PT with reports of chronic bilateral knee pain, R>L. Denies trauma or MOI, notes pain has been gradually increasing for 2 years. Most pain in R medial knee, stairs and prolonged walking greatly increase pain. She has had to stop exercising as much due to increases in pain. Denies N/T or other red flag times. Feels her R knee will occasionally buckle.   PERTINENT HISTORY: DMII  PAIN:  Are you having pain?  Yes: NPRS scale: 6/10 Worst: 8/10 Pain location: bilateral knee R>L Pain description: sharp Aggravating factors: prolonged walking, stairs, transfers Relieving factors: rest, ice  PRECAUTIONS: None  RED FLAGS: None   WEIGHT BEARING RESTRICTIONS: No  FALLS:  Has patient fallen in last 6 months? No  LIVING ENVIRONMENT: Lives with: lives with their family Lives in: House/apartment  OCCUPATION: Not currently working  PLOF: Independent  PATIENT GOALS: decrease knee pain, be able to be active with walking and exercise  NEXT MD VISIT: 11/09/2023   OBJECTIVE:  Note: Objective measures were  completed at Evaluation unless otherwise noted.  DIAGNOSTIC FINDINGS:  See imaging   PATIENT SURVEYS:  LEFS: 16/80  COGNITION: Overall cognitive status: Within functional limits for tasks assessed     SENSATION: WFL  POSTURE: rounded shoulders, forward head, and large body habitus  PALPATION: TTP to R patellar hypomobility, distal R quad   LOWER EXTREMITY ROM:  Active ROM Right eval Left eval  Hip flexion    Hip extension    Hip abduction    Hip adduction    Hip internal rotation    Hip external rotation    Knee flexion 120 125  Knee extension 0 0  Ankle dorsiflexion     Ankle plantarflexion    Ankle inversion    Ankle eversion     (Blank rows = not tested)  LOWER EXTREMITY MMT:  MMT Right eval Left eval  Hip flexion 3+/5 4/5  Hip extension    Hip abduction 3+/5 3+/5  Hip adduction    Hip internal rotation    Hip external rotation    Knee flexion 5/5 5/5  Knee extension 5/5 5/5  Ankle dorsiflexion    Ankle plantarflexion    Ankle inversion    Ankle eversion     (Blank rows = not tested)  LOWER EXTREMITY SPECIAL TESTS:  Knee special tests: Lachman Test: negative  FUNCTIONAL TESTS:  30 Second Sit to Stand: 8 reps with UE  GAIT: Distance walked: 80ft Assistive device utilized: None Level of assistance: Complete Independence Comments: antalgic gait R    TREATMENT: Right knee: McConnell tape to laterally pull patella and also medial fat pad tape Supine SLR 2x15 each S/L hip abd 2x15 each  STS 10 x 2  Standing hip abd 2x10 RTB Side squats at counter , red band at knees  Standing hip ext 2x10 RTB Standing TKE with ball x 10 - 5" hold R Step up 2x10 fwd  8in - no UE SLS on foam Right 10 sec best , L > 30 sec  Slant board stretch Functional squat  - 2x10 - B UE  LAQ 10 x 2 with ball squeeze    [facility] Adult PT Treatment:                                                DATE: 11/10/23 Therapeutic Exercise:  Supine OS x 10 - 5" hold Supine SLR 2x15 each McConnell tape with lateral glide bilaterally SAQ 2x10 2# S/L hip abd 2x15 Bridge with GTB 2x15  S/L clamshell 2x15 GTB Lateral walk YTB x 3 laps at counter Standing hip abd 2x10 YTB Standing TKE with ball x 10 - 5" hold R Therapeutic Activity: STS 2x10 - low table no UE Step up 2x10 fwd R stance 8in - no UE Functional squat  - 2x10 - B UE  [facility] Adult PT Treatment:                                                DATE: 11/06/23 Mcconnel tape to right knee SAQ 10 x 2 bilateral  QS x 10 right SLR 10 x 2 each , 1 set with 2#  Bridge 10 x 2  LAQ 5# 10 x 2 each , removed Right  weight  due to knee pain STS 10 x 2  8 inch step up 10 x 2 each Lateral walk YTB at counter x 3 laps Hip abdct 10 x 2 each in sidelying     [facility] Adult PT Treatment:                                                DATE: 11/03/2023 Therapeutic Exercise: McConnell tape to R knee Supine QS x 10 - 5" hold Supine SLR 2x10 2# each S/L hip abd 2x10 2# each Lateral walk YTB at counter x 3 laps Slant board calf stretch 2x30" LAQ 2x10 4# each Therapeutic Activity: NuStep lvl 5 LE only x 3 min for functional activity tolerance Step ups 2x10 fwd 8in each Functional squat 2x10 - B UE STS 2x10 - low table no UE  PATIENT EDUCATION:  Education details: HEP Person educated: Patient Education method: Explanation, Demonstration, and Handouts Education comprehension: verbalized understanding and returned demonstration  HOME EXERCISE PROGRAM: Access Code: [email or web address] URL: [email or web address] Date: 11/03/2023 Prepared by: Edwinna Areola  Exercises - Supine Quadricep Sets  - 1 x daily - 7 x weekly - 2 sets - 10 reps - 5 sec hold - Active Straight Leg Raise with Quad Set  - 1 x daily - 7 x weekly - 3 sets - 15 reps - Sidelying Hip Abduction  - 1 x daily - 7 x weekly - 3 sets - 10 reps - Clamshell with Resistance  - 1 x daily - 7 x weekly - 3 sets - 15 reps - green band hold - Side Stepping with Resistance at Ankles and Counter Support  - 1 x daily - 7 x weekly - 3 reps - yellow band hold - Sit to Stand Without Arm Support  - 1 x daily - 7 x weekly - 2 sets - 10 reps  ASSESSMENT:  CLINICAL IMPRESSION: Pt was able to complete all prescribed exercises with no adverse effect. Therapy today continued to focus particularly on quad and lateral hip strengthening in order to decrease knee pain and improve mobility. Pt reports tape is helpful for her knee pain. Added fat pad tape to medial knee with pt reporting decreased pain. Able to complete closed chain therex without increased pain.   Pt progressing  with therapy, will continue per POC. Could consider medial ionto patch due to point tenderness. Currently not in POC. Pt has tape at home and may bring it in to practice taping herself.   EVAL: Patient is a 53 y.o. F who was seen today for physical therapy evaluation and treatment for chronic bilateral knee pain and discomfort. Physical findings are consistnet with physicain impression as pt demonstrates decrease in quad and proximal hip strength as well as decrease in functional mobility. Pt LEFS score shows moderate disability in performance of home ADLs and higher level community activities. Pt would benefit from skilled PT services working on improving strength and functional mobility in order to decrease pain.    OBJECTIVE IMPAIRMENTS: decreased activity tolerance, decreased mobility, difficulty walking, decreased strength, and pain  ACTIVITY LIMITATIONS: sitting, standing, squatting, stairs, transfers, and locomotion level  PARTICIPATION LIMITATIONS: driving, shopping, community activity, occupation, and yard work  PERSONAL FACTORS: Time since onset of injury/illness/exacerbation are also affecting patient's functional outcome.   REHAB POTENTIAL: Good  CLINICAL DECISION MAKING: Stable/uncomplicated  EVALUATION COMPLEXITY: Low  GOALS: Goals reviewed with patient? No  SHORT TERM GOALS: Target date: 11/13/2023   Pt will be compliant and knowledgeable with initial HEP for improved comfort and carryover Baseline: initial HEP given  Goal status: MET  2.  Pt will self report bilateral knee pain no greater than 6/10 for improved comfort and functional ability Baseline: 10/10 at worst    11/13/23: resting pain 6/10  Goal status: ONGOING  LONG TERM GOALS: Target date: 12/18/2023   Pt will improve LEFS to no less than 30/80 as proxy for functional improvement with home ADLs and higher level community activity Baseline: 16/80 Goal status: INITIAL   2.  Pt will self report bilateral  knee pain no greater than 3/10 for improved comfort and functional ability Baseline: 10/10 at worst Goal status: INITIAL   3.  Pt will increase 30 Second Sit to Stand rep count to no less than 10 reps for improved balance, strength, and functional mobility Baseline: 8 reps with UE Goal status: INITIAL   4.  Pt will improve bilateral LE MMT to no less than 4/5 for all tested motions for improved functional mobility and decrease knee pain Baseline:  Goal status: INITIAL    PLAN:  PT FREQUENCY: 2x/week  PT DURATION: 8 weeks  PLANNED INTERVENTIONS: 97164- PT Re-evaluation, 97110-Therapeutic exercises, 97530- Therapeutic activity, [id number]- Neuromuscular re-education, 97535- Self Care, 30160- Manual therapy, [id number]- Gait training, F0932- Electrical stimulation (unattended), [id number]- Electrical stimulation (manual), and Dry Needling  PLAN FOR NEXT SESSION: assess HEP response, eccentric quad and proximal hip strengthening, consider ionto   For all possible CPT codes, reference the Planned Interventions line above.     Check all conditions that are expected to impact treatment: {Conditions expected to impact treatment:Diabetes mellitus and Musculoskeletal disorders   If treatment provided at initial evaluation, no treatment charged due to lack of authorization.       Jannette Spanner, PTA 11/13/23 9:40 AM Phone: [phone] Fax: [phone]

## 2023-11-16 ENCOUNTER — Ambulatory Visit: Admitting: Physical Therapy

## 2023-11-17 ENCOUNTER — Encounter: Admitting: Physical Therapy

## 2023-11-25 ENCOUNTER — Ambulatory Visit

## 2023-11-25 DIAGNOSIS — G8929 Other chronic pain: Secondary | ICD-10-CM

## 2023-11-25 DIAGNOSIS — M1711 Unilateral primary osteoarthritis, right knee: Secondary | ICD-10-CM | POA: Diagnosis not present

## 2023-11-25 NOTE — Therapy (Signed)
 OUTPATIENT PHYSICAL THERAPY TREATMENT   Patient Name: Lauren Andersen MRN: [id number] DOB:[birth year], 53 y.o., female Today's Date: 11/25/2023  END OF SESSION:  PT End of Session - 11/25/23 1408     Visit Number 6    Number of Visits 17    Date for PT Re-Evaluation 12/18/23    Authorization Type [employer]    PT Start Time 1409   arrived late   PT Stop Time 1440    PT Time Calculation (min) 31 min    Activity Tolerance Patient tolerated treatment well    Behavior During Therapy [facility] for tasks assessed/performed                Past Medical History:  Diagnosis Date   Abdominal pain    Arthritis Dec 2023   Fatty liver 08/04/2010   Headache(784.0)    Hepatic steatosis    Past Surgical History:  Procedure Laterality Date   bladder mesh  2008   BLADDER SURGERY     CESAREAN SECTION     x2   colon mesh     RECTAL PROLAPSE REPAIR  2008   TUBAL LIGATION     Patient Active Problem List   Diagnosis Date Noted   Type 2 diabetes mellitus with morbid obesity (HCC) 09/25/2023   Post-menopausal 09/17/2023   Gastroesophageal reflux disease 09/17/2023   Chronic pain of right knee 03/26/2022   History of prediabetes 01/29/2022   Severe obesity (BMI >= 40) (HCC) 01/29/2022   History of cyst of breast 01/29/2022   RUQ abdominal pain 01/29/2022   Screen for colon cancer 01/29/2022   Dyspareunia, female 10/08/2020   Fibroids 10/08/2020   High-tone pelvic floor dysfunction 10/08/2020   Pelvic pressure in female 10/08/2020   PVD (peripheral vascular disease) (HCC) 07/23/2020   Pain of both breasts 08/22/2014   Pap smear for cervical cancer screening 08/22/2014   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 Pt has been compliant with HEP.   EVAL: Pt presents to PT with reports of chronic bilateral knee pain, R>L. Denies trauma or MOI, notes pain has been gradually increasing for 2 years. Most pain in R medial knee, stairs and prolonged walking greatly increase pain. She has had to stop exercising as much due to increases in pain. Denies N/T or other red flag times. Feels her R knee will occasionally buckle.   PERTINENT HISTORY: DMII  PAIN:  Are you having pain?  Yes: NPRS scale: 6/10 Worst: 8/10 Pain location: bilateral knee R>L Pain description: sharp Aggravating factors: prolonged walking, stairs, transfers Relieving factors: rest, ice  PRECAUTIONS: None  RED FLAGS: None   WEIGHT BEARING RESTRICTIONS: No  FALLS:  Has patient fallen in last 6 months? No  LIVING ENVIRONMENT: Lives with: lives with their family Lives in: House/apartment  OCCUPATION: Not currently working  PLOF: Independent  PATIENT GOALS: decrease knee pain, be able to be active  with walking and exercise  NEXT MD VISIT: 11/09/2023   OBJECTIVE:  Note: Objective measures were completed at Evaluation unless otherwise noted.  DIAGNOSTIC FINDINGS:  See imaging   PATIENT SURVEYS:  LEFS: 16/80  COGNITION: Overall cognitive status: Within functional limits for tasks assessed     SENSATION: WFL  POSTURE: rounded shoulders, forward head, and large body habitus  PALPATION: TTP to R patellar hypomobility, distal R quad   LOWER EXTREMITY ROM:  Active ROM Right eval Left eval  Hip flexion    Hip extension    Hip abduction    Hip adduction     Hip internal rotation    Hip external rotation    Knee flexion 120 125  Knee extension 0 0  Ankle dorsiflexion    Ankle plantarflexion    Ankle inversion    Ankle eversion     (Blank rows = not tested)  LOWER EXTREMITY MMT:  MMT Right eval Left eval  Hip flexion 3+/5 4/5  Hip extension    Hip abduction 3+/5 3+/5  Hip adduction    Hip internal rotation    Hip external rotation    Knee flexion 5/5 5/5  Knee extension 5/5 5/5  Ankle dorsiflexion    Ankle plantarflexion    Ankle inversion    Ankle eversion     (Blank rows = not tested)  LOWER EXTREMITY SPECIAL TESTS:  Knee special tests: Lachman Test: negative  FUNCTIONAL TESTS:  30 Second Sit to Stand: 8 reps with UE  GAIT: Distance walked: 54ft Assistive device utilized: None Level of assistance: Complete Independence Comments: antalgic gait R    TREATMENT: [facility] Adult PT Treatment:                                                DATE: 11/25/23 Therapeutic Exercise:  Supine OS x 10 - 5" hold Supine SLR 2x10 2# each S/L hip abd 2x10 2# each Bridge with black 3x10  Hooklying clamshell 2x15 black band Lateral walk RTB x 3 laps at counter Standing hip abd 2x10 RTB Eccentric heel tap x 10 - R painful Standing TKE red power cord 2x15 - 5" hold R Therapeutic Activity: STS 2x10 10# - low table Step up 2x10 fwd R stance 8in - no UE Functional squat  - 2x10 - B UE   [facility] Adult PT Treatment:                                                DATE: 11/10/23 Therapeutic Exercise:  Supine OS x 10 - 5" hold Supine SLR 2x15 each McConnell tape with lateral glide bilaterally SAQ 2x10 2# S/L hip abd 2x15 Bridge with GTB 2x15  S/L clamshell 2x15 GTB Lateral walk YTB x 3 laps at counter Standing hip abd 2x10 YTB Standing TKE with ball x 10 - 5" hold R Therapeutic Activity: STS 2x10 - low table no UE Step up 2x10 fwd R stance 8in - no UE Functional squat  - 2x10 - B UE  [facility] Adult PT Treatment:  DATE: 11/06/23 Mcconnel tape to right knee SAQ 10 x 2 bilateral  QS x 10 right SLR 10 x 2 each , 1 set with 2#  Bridge 10 x 2  LAQ 5# 10 x 2 each , removed Right weight due to knee pain STS 10 x 2  8 inch step up 10 x 2 each Lateral walk YTB at counter x 3 laps Hip abdct 10 x 2 each in sidelying    PATIENT EDUCATION:  Education details: HEP Person educated: Patient Education method: Explanation, Demonstration, and Handouts Education comprehension: verbalized understanding and returned demonstration  HOME EXERCISE PROGRAM: Access Code: [email or web address] URL: [email or web address] Date: 11/03/2023 Prepared by: Loral Roch  Exercises - Supine Quadricep Sets  - 1 x daily - 7 x weekly - 2 sets - 10 reps - 5 sec hold - Active Straight Leg Raise with Quad Set  - 1 x daily - 7 x weekly - 3 sets - 15 reps - Sidelying Hip Abduction  - 1 x daily - 7 x weekly - 3 sets - 10 reps - Clamshell with Resistance  - 1 x daily - 7 x weekly - 3 sets - 15 reps - green band hold - Side Stepping with Resistance at Ankles and Counter Support  - 1 x daily - 7 x weekly - 3 reps - yellow band hold - Sit to Stand Without Arm Support  - 1 x daily - 7 x weekly - 2 sets - 10 reps  ASSESSMENT:  CLINICAL IMPRESSION: Pt was able to complete all prescribed exercises with no adverse effect with exception of eccentric loading on R. Therapy today continued to focus particularly on quad and lateral hip strengthening in order to decrease knee pain and improve mobility. Pt progressing with therapy, will continue per POC.   EVAL: Patient is a 53 y.o. F who was seen today for physical therapy evaluation and treatment for chronic bilateral knee pain and discomfort. Physical findings are consistnet with physicain impression as pt demonstrates decrease in quad and proximal hip strength as well as decrease in functional mobility. Pt LEFS score shows moderate disability in performance of home ADLs and  higher level community activities. Pt would benefit from skilled PT services working on improving strength and functional mobility in order to decrease pain.    OBJECTIVE IMPAIRMENTS: decreased activity tolerance, decreased mobility, difficulty walking, decreased strength, and pain  ACTIVITY LIMITATIONS: sitting, standing, squatting, stairs, transfers, and locomotion level  PARTICIPATION LIMITATIONS: driving, shopping, community activity, occupation, and yard work  PERSONAL FACTORS: Time since onset of injury/illness/exacerbation are also affecting patient's functional outcome.   REHAB POTENTIAL: Good  CLINICAL DECISION MAKING: Stable/uncomplicated  EVALUATION COMPLEXITY: Low   GOALS: Goals reviewed with patient? No  SHORT TERM GOALS: Target date: 11/13/2023   Pt will be compliant and knowledgeable with initial HEP for improved comfort and carryover Baseline: initial HEP given  Goal status: MET  2.  Pt will self report bilateral knee pain no greater than 6/10 for improved comfort and functional ability Baseline: 10/10 at worst    11/13/23: resting pain 6/10  Goal status: ONGOING  LONG TERM GOALS: Target date: 12/18/2023   Pt will improve LEFS to no less than 30/80 as proxy for functional improvement with home ADLs and higher level community activity Baseline: 16/80 Goal status: INITIAL   2.  Pt will self report bilateral knee pain no greater than 3/10 for improved comfort and functional ability Baseline: 10/10 at worst Goal status: INITIAL   3.  Pt will increase 30 Second Sit to Stand rep count to no less than 10 reps for improved balance, strength, and functional mobility Baseline: 8 reps with UE Goal status: INITIAL   4.  Pt will improve bilateral LE MMT to no less than 4/5 for all tested motions for improved functional mobility and decrease knee pain Baseline:  Goal status: INITIAL    PLAN:  PT FREQUENCY: 2x/week  PT DURATION: 8 weeks  PLANNED INTERVENTIONS:  97164- PT Re-evaluation, 97110-Therapeutic exercises, 97530- Therapeutic activity, [id number]- Neuromuscular re-education, 97535- Self Care, 16109- Manual therapy, [id number]- Gait training, U0454- Electrical stimulation (unattended), [id number]- Electrical stimulation (manual), and Dry Needling  PLAN FOR NEXT SESSION: assess HEP response, eccentric quad and proximal hip strengthening, consider ionto   For all possible CPT codes, reference the Planned Interventions line above.     Check all conditions that are expected to impact treatment: {Conditions expected to impact treatment:Diabetes mellitus and Musculoskeletal disorders   If treatment provided at initial evaluation, no treatment charged due to lack of authorization.       Ivor Mars PT  11/25/23 3:43 PM

## 2023-11-26 ENCOUNTER — Ambulatory Visit

## 2023-11-26 DIAGNOSIS — M1711 Unilateral primary osteoarthritis, right knee: Secondary | ICD-10-CM | POA: Diagnosis not present

## 2023-11-26 DIAGNOSIS — G8929 Other chronic pain: Secondary | ICD-10-CM

## 2023-11-26 NOTE — Therapy (Signed)
 OUTPATIENT PHYSICAL THERAPY TREATMENT   Patient Name: Lauren Andersen MRN: [id number] DOB:05/22/1971, 53 y.o., female Today's Date: 11/26/2023  END OF SESSION:  PT End of Session - 11/26/23 1024     Visit Number 7    Number of Visits 17    Date for PT Re-Evaluation 12/18/23    Authorization Type [employer]    PT Start Time 1025    PT Stop Time 1055    PT Time Calculation (min) 30 min    Activity Tolerance Patient tolerated treatment well    Behavior During Therapy [facility] for tasks assessed/performed                 Past Medical History:  Diagnosis Date   Abdominal pain    Arthritis Dec 2023   Fatty liver 08/04/2010   Headache(784.0)    Hepatic steatosis    Past Surgical History:  Procedure Laterality Date   bladder mesh  2008   BLADDER SURGERY     CESAREAN SECTION     x2   colon mesh     RECTAL PROLAPSE REPAIR  2008   TUBAL LIGATION     Patient Active Problem List   Diagnosis Date Noted   Type 2 diabetes mellitus with morbid obesity (HCC) 09/25/2023   Post-menopausal 09/17/2023   Gastroesophageal reflux disease 09/17/2023   Chronic pain of right knee 03/26/2022   History of prediabetes 01/29/2022   Severe obesity (BMI >= 40) (HCC) 01/29/2022   History of cyst of breast 01/29/2022   RUQ abdominal pain 01/29/2022   Screen for colon cancer 01/29/2022   Dyspareunia, female 10/08/2020   Fibroids 10/08/2020   High-tone pelvic floor dysfunction 10/08/2020   Pelvic pressure in female 10/08/2020   PVD (peripheral vascular disease) (HCC) 07/23/2020   Pain of both breasts 08/22/2014   Pap smear for cervical cancer screening 08/22/2014   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 Had to work late last [employer].   EVAL: Pt presents to PT with reports of chronic bilateral knee pain, R>L. Denies trauma or MOI, notes pain has been gradually increasing for 2 years. Most pain in R medial knee, stairs and prolonged walking greatly increase pain. She has had to stop exercising as much due to increases in pain. Denies N/T or other red flag times. Feels her R knee will occasionally buckle.   PERTINENT HISTORY: DMII  PAIN:  Are you having pain?  Yes: NPRS scale: 6/10 Worst: 8/10 Pain location: bilateral knee R>L Pain description: sharp Aggravating factors: prolonged walking, stairs, transfers Relieving factors: rest, ice  PRECAUTIONS: None  RED FLAGS: None   WEIGHT BEARING RESTRICTIONS: No  FALLS:  Has patient fallen in last 6 months? No  LIVING ENVIRONMENT: Lives with: lives with their family Lives in: House/apartment  OCCUPATION: Not currently working  PLOF: Independent  PATIENT GOALS: decrease knee pain, be able to be active with walking and exercise  NEXT MD VISIT: 11/09/2023   OBJECTIVE:  Note: Objective measures were completed at Evaluation unless otherwise noted.  DIAGNOSTIC FINDINGS:  See imaging   PATIENT SURVEYS:  LEFS: 16/80  COGNITION: Overall cognitive status: Within functional limits for tasks assessed     SENSATION: WFL  POSTURE: rounded shoulders, forward head, and large body habitus  PALPATION: TTP to R patellar hypomobility, distal R quad   LOWER EXTREMITY ROM:  Active ROM Right eval Left eval  Hip flexion    Hip extension    Hip abduction    Hip adduction    Hip internal rotation    Hip external  rotation    Knee flexion 120 125  Knee extension 0 0  Ankle dorsiflexion    Ankle plantarflexion    Ankle inversion    Ankle eversion     (Blank rows = not tested)  LOWER EXTREMITY MMT:  MMT Right eval Left eval  Hip flexion 3+/5 4/5  Hip extension    Hip abduction 3+/5 3+/5  Hip adduction    Hip internal rotation    Hip external rotation    Knee flexion 5/5 5/5  Knee extension 5/5 5/5  Ankle dorsiflexion    Ankle plantarflexion    Ankle inversion    Ankle eversion     (Blank rows = not tested)  LOWER EXTREMITY SPECIAL TESTS:  Knee special tests: Lachman Test: negative  FUNCTIONAL TESTS:  30 Second Sit to Stand: 8 reps with UE  GAIT: Distance walked: 66ft Assistive device utilized: None Level of assistance: Complete Independence Comments: antalgic gait R    TREATMENT: [facility] Adult PT Treatment:                                                DATE: 11/26/23 Therapeutic Exercise:  Supine OS x 10 - 5" hold Supine SLR 2x15 S/L hip abd 2x15 Bridge with GTB 3x10  Hooklying clamshell 3x15 GTB LAQ 2x10 4# Standing TKE red power cord 2x15 - 5" hold R Therapeutic Activity: STS 2x10 10# - low table Rec bike lvl 3 x 3 min for functional activity tolerance Functional squat  - 2x10 - B UE  [facility] Adult PT Treatment:                                                DATE: 11/25/23 Therapeutic Exercise:  Supine OS x 10 - 5" hold Supine SLR 2x10 2# each S/L hip abd 2x10 2# each Bridge with black 3x10  Hooklying clamshell 2x15 black band Lateral walk RTB x 3 laps at counter Standing hip abd 2x10 RTB Eccentric heel tap x 10 - R painful Standing TKE red power cord 2x15 - 5" hold R Therapeutic Activity: STS 2x10 10# - low table Step up 2x10 fwd R stance 8in - no UE Functional squat  - 2x10 - B UE   [facility] Adult PT Treatment:                                                DATE: 11/10/23 Therapeutic Exercise:  Supine OS x 10 -  5" hold Supine SLR 2x15 each McConnell tape with  lateral glide bilaterally SAQ 2x10 2# S/L hip abd 2x15 Bridge with GTB 2x15  S/L clamshell 2x15 GTB Lateral walk YTB x 3 laps at counter Standing hip abd 2x10 YTB Standing TKE with ball x 10 - 5" hold R Therapeutic Activity: STS 2x10 - low table no UE Step up 2x10 fwd R stance 8in - no UE Functional squat  - 2x10 - B UE  [facility] Adult PT Treatment:                                                DATE: 11/06/23 Mcconnel tape to right knee SAQ 10 x 2 bilateral  QS x 10 right SLR 10 x 2 each , 1 set with 2#  Bridge 10 x 2  LAQ 5# 10 x 2 each , removed Right weight due to knee pain STS 10 x 2  8 inch step up 10 x 2 each Lateral walk YTB at counter x 3 laps Hip abdct 10 x 2 each in sidelying    PATIENT EDUCATION:  Education details: HEP Person educated: Patient Education method: Explanation, Demonstration, and Handouts Education comprehension: verbalized understanding and returned demonstration  HOME EXERCISE PROGRAM: Access Code: [email or web address] URL: [email or web address] Date: 11/03/2023 Prepared by: Loral Roch  Exercises - Supine Quadricep Sets  - 1 x daily - 7 x weekly - 2 sets - 10 reps - 5 sec hold - Active Straight Leg Raise with Quad Set  - 1 x daily - 7 x weekly - 3 sets - 15 reps - Sidelying Hip Abduction  - 1 x daily - 7 x weekly - 3 sets - 10 reps - Clamshell with Resistance  - 1 x daily - 7 x weekly - 3 sets - 15 reps - green band hold - Side Stepping with Resistance at Ankles and Counter Support  - 1 x daily - 7 x weekly - 3 reps - yellow band hold - Sit to Stand Without Arm Support  - 1 x daily - 7 x weekly - 2 sets - 10 reps  ASSESSMENT:  CLINICAL IMPRESSION: Pt was able to complete prescribed exercises with no adverse effect with exception of some continued R knee pain with loaded extension. Today we continued to focus on improving LE strength and functional mobility. Will continue to progress as able per POC.    EVAL: Patient is a 53 y.o. F who was  seen today for physical therapy evaluation and treatment for chronic bilateral knee pain and discomfort. Physical findings are consistnet with physicain impression as pt demonstrates decrease in quad and proximal hip strength as well as decrease in functional mobility. Pt LEFS score shows moderate disability in performance of home ADLs and higher level community activities. Pt would benefit from skilled PT services working on improving strength and functional mobility in order to decrease pain.    OBJECTIVE IMPAIRMENTS: decreased activity tolerance, decreased mobility, difficulty walking, decreased strength, and pain  ACTIVITY LIMITATIONS: sitting, standing, squatting, stairs, transfers, and locomotion level  PARTICIPATION LIMITATIONS: driving, shopping, community activity, occupation, and yard work  PERSONAL FACTORS: Time since onset of injury/illness/exacerbation are also affecting patient's functional outcome.   REHAB POTENTIAL: Good  CLINICAL DECISION MAKING: Stable/uncomplicated  EVALUATION COMPLEXITY: Low   GOALS: Goals reviewed with patient? No  SHORT TERM GOALS: Target date: 11/13/2023  Pt will be compliant and knowledgeable with initial HEP for improved comfort and carryover Baseline: initial HEP given  Goal status: MET  2.  Pt will self report bilateral knee pain no greater than 6/10 for improved comfort and functional ability Baseline: 10/10 at worst    11/13/23: resting pain 6/10  Goal status: ONGOING  LONG TERM GOALS: Target date: 12/18/2023   Pt will improve LEFS to no less than 30/80 as proxy for functional improvement with home ADLs and higher level community activity Baseline: 16/80 Goal status: INITIAL   2.  Pt will self report bilateral knee pain no greater than 3/10 for improved comfort and functional ability Baseline: 10/10 at worst Goal status: INITIAL   3.  Pt will increase 30 Second Sit to Stand rep count to no less than 10 reps for improved balance,  strength, and functional mobility Baseline: 8 reps with UE Goal status: INITIAL   4.  Pt will improve bilateral LE MMT to no less than 4/5 for all tested motions for improved functional mobility and decrease knee pain Baseline:  Goal status: INITIAL    PLAN:  PT FREQUENCY: 2x/week  PT DURATION: 8 weeks  PLANNED INTERVENTIONS: 97164- PT Re-evaluation, 97110-Therapeutic exercises, 97530- Therapeutic activity, [id number]- Neuromuscular re-education, 97535- Self Care, [id number]- Manual therapy, [id number]- Gait training, W1191- Electrical stimulation (unattended), [id number]- Electrical stimulation (manual), and Dry Needling  PLAN FOR NEXT SESSION: assess HEP response, eccentric quad and proximal hip strengthening, consider ionto   For all possible CPT codes, reference the Planned Interventions line above.     Check all conditions that are expected to impact treatment: {Conditions expected to impact treatment:Diabetes mellitus and Musculoskeletal disorders   If treatment provided at initial evaluation, no treatment charged due to lack of authorization.       Ivor Mars PT  11/26/23 10:56 AM

## 2023-11-27 NOTE — Progress Notes (Signed)
 11/30/2023 Lauren Andersen [id number] 03/30/1971  Referring provider: Abram Abraham, NP-C Primary GI doctor: Dr. Rosaline Coma ( Dr. Sandrea Cruel)  ASSESSMENT AND PLAN:   Postprandial right upper quadrant abdominal pain 2023 AB US  normal GB CT abdomen pelvis with contrast 2023 unremarkable 09/17/2023 labs reviewed CBC without anemia or leukocytosis Kidney liver function normal, thyroid normal, ferritin 64 Was controlling with diet and PPI however it has been worse, worse with greasy foods, can have nausea/vomiting, having non cardiac chest pain, better with beltching No dysphagia, no melena Some mild weight loss with working 3 weeks ago, ibuprofen  400 mg every 2 weeks, no ETOH -Lifestyle changes discussed, avoid NSAIDS, ETOH, hand out given to the patient - start on pantoprazole 20 mg daily for 20 days -Weight loss discussed with the patient I discussed risks of EGD with colon at [facility] with patient today, including risk of sedation, bleeding or perforation. Patient provides understanding and gave verbal consent to proceed. -Will get right RUQ US  and consider HIDA  Hepatic steatosis seen on CT abdomen pelvis with contrast 2023 with history of small liver hemangioma 09/17/2023 hepatitis C negative -Get RUQ US  -Normal liver test, consider hepatocellular work up if LFTs are elevated  Screening colonoscopy Has had FIT test No family history of colon cancer Schedule for colonoscopy  Morbid obesity  Body mass index is 39.14 kg/m.  -Patient has been advised to make an attempt to improve diet and exercise patterns to aid in weight loss. -Recommended diet heavy in fruits and veggies and low in animal meats, cheeses, and dairy products, appropriate calorie intake   Patient Care Team: Abram Abraham, NP-C as PCP - General (Family Medicine) Collins Dean, NP (Nurse Practitioner) Burnie Cartwright, RN as Registered Nurse Arlette Benders, RN (Inactive) as Registered  Nurse  HISTORY OF PRESENT ILLNESS: 53 y.o. Spanish-speaking female with a past medical history listed below presents for evaluation of GERD.  Last seen in the office 04/23/2022 by Mai Schwalbe for hepatic steatosis and right upper quadrant postprandial pain with nausea and vomiting, she was set up for EGD and screening colonoscopy but never proceeded secondary to cost..  Discussed the use of AI scribe software for clinical note transcription with the patient, who gave verbal consent to proceed.  History of Present Illness   Lauren Andersen is a 53 year old female who presents with symptoms of acid reflux and chest pain.  She experiences acid reflux symptoms, particularly after consuming greasy foods, which lead to vomiting. She manages her symptoms with dietary changes and previously noted improvement with an unspecified medication for acid reflux. She is cautious with her diet to avoid these symptoms.  She describes a pain in the middle of her chest that occurs when she attempts to burp, which is relieved after burping. No difficulty swallowing, dark stools, or shortness of breath. She has experienced weight loss of five pounds over the past three weeks, which she attributes to starting work.  In 2023, a scan revealed a benign hemangioma on her liver. There is no family history of gallbladder issues. She has not undergone a colonoscopy but has completed a fecal occult blood test.  She occasionally takes Tylenol 650 mg and ibuprofen  400 mg for knee pain, approximately twice a week. She denies regular alcohol consumption.        She  reports that she has never smoked. She has never used smokeless tobacco. She reports that she does not drink alcohol  and does not use drugs.  RELEVANT GI HISTORY, IMAGING AND LABS: Results   RADIOLOGY Liver MRI: Hemangioma (2023)  DIAGNOSTIC REPORTS Cologuard: Negative (2023)      CBC    Component Value Date/Time   WBC 6.4 09/17/2023 0937    RBC 4.52 09/17/2023 0937   HGB 13.4 09/17/2023 0937   HGB 13.3 06/25/2020 1121   HCT 40.8 09/17/2023 0937   HCT 40.2 06/25/2020 1121   PLT 242.0 09/17/2023 0937   PLT 259 06/25/2020 1121   MCV 90.2 09/17/2023 0937   MCV 89 06/25/2020 1121   MCH 29.3 06/25/2020 1121   MCH 30.2 08/22/2014 1027   MCHC 32.9 09/17/2023 0937   RDW 13.9 09/17/2023 0937   RDW 12.6 06/25/2020 1121   LYMPHSABS 2.0 09/17/2023 0937   MONOABS 0.5 09/17/2023 0937   EOSABS 0.5 09/17/2023 0937   BASOSABS 0.1 09/17/2023 0937   Recent Labs    09/17/23 0937  HGB 13.4    CMP     Component Value Date/Time   NA 140 09/17/2023 0937   NA 140 06/25/2020 1121   K 4.0 09/17/2023 0937   CL 103 09/17/2023 0937   CO2 29 09/17/2023 0937   GLUCOSE 107 (H) 09/17/2023 0937   BUN 17 09/17/2023 0937   BUN 13 06/25/2020 1121   CREATININE 0.64 09/17/2023 0937   CREATININE 0.69 08/22/2014 1027   CALCIUM 9.4 09/17/2023 0937   PROT 7.8 09/17/2023 0937   PROT 7.2 06/25/2020 1121   ALBUMIN 4.4 09/17/2023 0937   ALBUMIN 4.5 06/25/2020 1121   AST 17 09/17/2023 0937   ALT 17 09/17/2023 0937   ALKPHOS 44 09/17/2023 0937   BILITOT 0.6 09/17/2023 0937   BILITOT 0.3 06/25/2020 1121   GFRNONAA 107 06/25/2020 1121   GFRNONAA >89 08/22/2014 1027   GFRAA 124 06/25/2020 1121   GFRAA >89 08/22/2014 1027      Latest Ref Rng & Units 09/17/2023    9:37 AM 01/29/2022    3:56 PM 06/25/2020   11:21 AM  Hepatic Function  Total Protein 6.0 - 8.3 g/dL 7.8  7.7  7.2   Albumin 3.5 - 5.2 g/dL 4.4  4.3  4.5   AST 0 - 37 U/L 17  20  14    ALT 0 - 35 U/L 17  25  18    Alk Phosphatase 39 - 117 U/L 44  42  46   Total Bilirubin 0.2 - 1.2 mg/dL 0.6  0.4  0.3       Current Medications:        Current Outpatient Medications (Other):    Multiple Vitamin (MULTIVITAMIN) tablet, Take 1 tablet by mouth daily.   Nutritional Supp - Diet Aids (FAT BURNER THERAPY PO), Take by mouth.   pantoprazole (PROTONIX) 20 MG tablet, Take 1 tablet (20  mg total) by mouth daily.  Medical History:  Past Medical History:  Diagnosis Date   Abdominal pain    Arthritis Dec 2023   Fatty liver 08/04/2010   Headache(784.0)    Hepatic steatosis    Allergies: No Known Allergies   Surgical History:  She  has a past surgical history that includes Cesarean section; Bladder surgery; Tubal ligation; bladder mesh (2008); Rectal prolapse repair (2008); and colon mesh. Family History:  Her family history includes Breast cancer in her cousin; Cancer in her maternal aunt and maternal aunt; Colon polyps in her mother; Depression in her mother; Mental illness in her mother; Thyroid disease in her mother.  REVIEW OF  SYSTEMS  : All other systems reviewed and negative except where noted in the History of Present Illness.  PHYSICAL EXAM: BP 122/82   Pulse 61   Ht 5\' 2"  (1.575 m)   Wt 214 lb (97.1 kg)   BMI 39.14 kg/m  Physical Exam   GENERAL APPEARANCE: Well nourished, in no apparent distress. HEENT: No cervical lymphadenopathy, unremarkable thyroid, sclerae anicteric, conjunctiva pink. RESPIRATORY: Respiratory effort normal, breath sounds equal bilaterally without rales, rhonchi, or wheezing. Lungs clear to auscultation bilaterally. CARDIO: Regular rate and rhythm with no murmurs, rubs, or gallops, peripheral pulses intact. ABDOMEN: Soft, non-distended, active bowel sounds in all four quadrants, right upper quadrant tenderness, no rebound, no mass appreciated. RECTAL: Declines. MUSCULOSKELETAL: Full range of motion, normal gait, without edema. SKIN: Dry, intact without rashes or lesions. No jaundice. NEURO: Alert, oriented, no focal deficits. PSYCH: Cooperative, normal mood and affect.      Edmonia Gottron, PA-C 10:07 AM

## 2023-11-30 ENCOUNTER — Other Ambulatory Visit (INDEPENDENT_AMBULATORY_CARE_PROVIDER_SITE_OTHER)

## 2023-11-30 ENCOUNTER — Ambulatory Visit (INDEPENDENT_AMBULATORY_CARE_PROVIDER_SITE_OTHER): Admitting: Physician Assistant

## 2023-11-30 ENCOUNTER — Encounter: Payer: Self-pay | Admitting: Physician Assistant

## 2023-11-30 VITALS — BP 122/82 | HR 61 | Ht 62.0 in | Wt 214.0 lb

## 2023-11-30 DIAGNOSIS — R1011 Right upper quadrant pain: Secondary | ICD-10-CM | POA: Diagnosis not present

## 2023-11-30 DIAGNOSIS — K760 Fatty (change of) liver, not elsewhere classified: Secondary | ICD-10-CM

## 2023-11-30 DIAGNOSIS — Z1211 Encounter for screening for malignant neoplasm of colon: Secondary | ICD-10-CM

## 2023-11-30 DIAGNOSIS — Z6839 Body mass index (BMI) 39.0-39.9, adult: Secondary | ICD-10-CM

## 2023-11-30 LAB — COMPREHENSIVE METABOLIC PANEL WITH GFR
ALT: 18 U/L (ref 0–35)
AST: 17 U/L (ref 0–37)
Albumin: 4.3 g/dL (ref 3.5–5.2)
Alkaline Phosphatase: 36 U/L — ABNORMAL LOW (ref 39–117)
BUN: 12 mg/dL (ref 6–23)
CO2: 27 meq/L (ref 19–32)
Calcium: 8.9 mg/dL (ref 8.4–10.5)
Chloride: 107 meq/L (ref 96–112)
Creatinine, Ser: 0.64 mg/dL (ref 0.40–1.20)
GFR: 101.55 mL/min (ref >60.00)
Glucose, Bld: 114 mg/dL — ABNORMAL HIGH (ref 70–99)
Potassium: 3.9 meq/L (ref 3.5–5.1)
Sodium: 141 meq/L (ref 135–145)
Total Bilirubin: 0.5 mg/dL (ref 0.2–1.2)
Total Protein: 7.2 g/dL (ref 6.0–8.3)

## 2023-11-30 LAB — CBC WITH DIFFERENTIAL/PLATELET
Basophils Absolute: 0.1 10*3/uL (ref 0.0–0.1)
Basophils Relative: 1.2 % (ref 0.0–3.0)
Eosinophils Absolute: 0.5 10*3/uL (ref 0.0–0.7)
Eosinophils Relative: 8.5 % — ABNORMAL HIGH (ref 0.0–5.0)
HCT: 40.7 % (ref 36.0–46.0)
Hemoglobin: 13.2 g/dL (ref 12.0–15.0)
Lymphocytes Relative: 38.5 % (ref 12.0–46.0)
Lymphs Abs: 2.4 10*3/uL (ref 0.7–4.0)
MCHC: 32.3 g/dL (ref 30.0–36.0)
MCV: 91.3 fl (ref 78.0–100.0)
Monocytes Absolute: 0.5 10*3/uL (ref 0.1–1.0)
Monocytes Relative: 7.9 % (ref 3.0–12.0)
Neutro Abs: 2.7 10*3/uL (ref 1.4–7.7)
Neutrophils Relative %: 43.9 % (ref 43.0–77.0)
Platelets: 235 10*3/uL (ref 150.0–400.0)
RBC: 4.46 Mil/uL (ref 3.87–5.11)
RDW: 14.3 % (ref 11.5–15.5)
WBC: 6.2 10*3/uL (ref 4.0–10.5)

## 2023-11-30 LAB — LIPASE: Lipase: 40 U/L (ref 11.0–59.0)

## 2023-11-30 MED ORDER — PANTOPRAZOLE SODIUM 20 MG PO TBEC: 20.0000 mg | DELAYED_RELEASE_TABLET | Freq: Every day | ORAL | 0 refills | Status: DC

## 2023-11-30 MED ORDER — NA SULFATE-K SULFATE-MG SULF 17.5-3.13-1.6 GM/177ML PO SOLN: 1.0000 | Freq: Once | ORAL | 0 refills | Status: AC

## 2023-11-30 NOTE — Patient Instructions (Addendum)
 Your provider has requested that you go to the basement level for lab work before leaving today. Press "B" on the elevator. The lab is located at the first door on the left as you exit the elevator.  Please take your proton pump inhibitor medication, pantoprazole 20 mg daily for 30 days  Please take this medication 30 minutes to 1 hour before meals- this makes it more effective.  Avoid spicy and acidic foods Avoid fatty foods Limit your intake of coffee, tea, alcohol, and carbonated drinks Work to maintain a healthy weight Keep the head of the bed elevated at least 3 inches with blocks or a wedge pillow if you are having any nighttime symptoms Stay upright for 2 hours after eating Avoid meals and snacks three to four hours before bedtime  Due to recent changes in healthcare laws, you may see the results of your imaging and laboratory studies on MyChart before your provider has had a chance to review them.  We understand that in some cases there may be results that are confusing or concerning to you. Not all laboratory results come back in the same time frame and the provider may be waiting for multiple results in order to interpret others.  Please give us  48 hours in order for your provider to thoroughly review all the results before contacting the office for clarification of your results.   You will be contacted by [facility] Scheduling in the next 2 days to arrange a RUQ Ultrasound.  The number on your caller ID will be [phone], please answer when they call.  If you have not heard from them in 2 days please call [phone] to schedule.     You have been scheduled for an endoscopy and colonoscopy. Please follow the written instructions given to you at your visit today.  If you use inhalers (even only as needed), please bring them with you on the day of your procedure.  DO NOT TAKE 7 DAYS PRIOR TO TEST- Trulicity (dulaglutide) Ozempic, Wegovy (semaglutide) Mounjaro  (tirzepatide) Bydureon Bcise (exanatide extended release)  DO NOT TAKE 1 DAY PRIOR TO YOUR TEST Rybelsus (semaglutide) Adlyxin (lixisenatide) Victoza (liraglutide) Byetta (exanatide) ___________________________________________________________________________

## 2023-11-30 NOTE — Progress Notes (Signed)
 I agree with the assessment and plan as outlined by Ms. Steffanie Dunn.

## 2023-12-02 ENCOUNTER — Other Ambulatory Visit (HOSPITAL_COMMUNITY): Payer: Self-pay

## 2023-12-03 ENCOUNTER — Ambulatory Visit (HOSPITAL_COMMUNITY)
Admission: RE | Admit: 2023-12-03 | Discharge: 2023-12-03 | Disposition: A | Discharge status: Home / Self Care | Source: Ambulatory Visit | Attending: Physician Assistant | Admitting: Physician Assistant

## 2023-12-03 DIAGNOSIS — R1011 Right upper quadrant pain: Secondary | ICD-10-CM | POA: Insufficient documentation

## 2023-12-03 DIAGNOSIS — K760 Fatty (change of) liver, not elsewhere classified: Secondary | ICD-10-CM | POA: Insufficient documentation

## 2023-12-04 ENCOUNTER — Ambulatory Visit: Attending: Sports Medicine

## 2023-12-04 DIAGNOSIS — G8929 Other chronic pain: Secondary | ICD-10-CM | POA: Diagnosis present

## 2023-12-04 DIAGNOSIS — M1711 Unilateral primary osteoarthritis, right knee: Secondary | ICD-10-CM | POA: Insufficient documentation

## 2023-12-04 DIAGNOSIS — M25561 Pain in right knee: Secondary | ICD-10-CM | POA: Diagnosis present

## 2023-12-04 NOTE — Therapy (Signed)
 OUTPATIENT PHYSICAL THERAPY TREATMENT/DISCHARGE  PHYSICAL THERAPY DISCHARGE SUMMARY  Visits from Start of Care: 8  Current functional level related to goals / functional outcomes: See goals and objective   Remaining deficits: See goals and objective   Education / Equipment: HEP   Patient agrees to discharge. Patient goals were partially met. Patient is being discharged due to meeting the stated rehab goals.   Patient Name: Lauren Andersen MRN: [id number] DOB:Jan 21, 1971, 53 y.o., female Today's Date: 12/04/2023  END OF SESSION:  PT End of Session - 12/04/23 1023     Visit Number 8    Number of Visits 17    Date for PT Re-Evaluation 12/18/23    Authorization Type [employer]    PT Start Time 1023    PT Stop Time 1050    PT Time Calculation (min) 27 min    Activity Tolerance Patient tolerated treatment well    Behavior During Therapy [facility] for tasks assessed/performed                  Past Medical History:  Diagnosis Date   Abdominal pain    Arthritis Dec 2023   Fatty liver 08/04/2010   Headache(784.0)    Hepatic steatosis    Past Surgical History:  Procedure Laterality Date   bladder mesh  2008   BLADDER SURGERY     CESAREAN SECTION     x2   colon mesh     RECTAL PROLAPSE REPAIR  2008   TUBAL LIGATION     Patient Active Problem List   Diagnosis Date Noted   Type 2 diabetes mellitus with morbid obesity (HCC) 09/25/2023   Post-menopausal 09/17/2023   Gastroesophageal reflux disease 09/17/2023   Chronic pain of right knee 03/26/2022   History of prediabetes 01/29/2022   Severe obesity (BMI >= 40) (HCC) 01/29/2022   History of cyst of breast 01/29/2022   RUQ abdominal pain 01/29/2022   Screen for colon cancer 01/29/2022   Dyspareunia, female 10/08/2020   Fibroids 10/08/2020   High-tone pelvic floor dysfunction 10/08/2020   Pelvic pressure in female 10/08/2020   PVD (peripheral vascular disease) (HCC) 07/23/2020   Pain of both breasts  08/22/2014   Pap smear for cervical cancer screening 08/22/2014   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 Has improved some with therapy but feels it has hit plateau. She feel last weekend as her feet slipped out from under her, noted increased pain after. Wants to hold on therapy until she sees Dr. Cleora Daft on 12/11/23.  EVAL: Pt presents to PT with reports of chronic bilateral knee pain, R>L. Denies trauma or MOI, notes pain has been gradually increasing for 2 years. Most pain in R medial knee, stairs and prolonged walking greatly increase pain. She has had to stop exercising as much due to increases in pain. Denies N/T or other red flag times. Feels her R knee will occasionally buckle.   PERTINENT HISTORY: DMII  PAIN:  Are  you having pain?  Yes: NPRS scale: 6/10 Worst: 8/10 Pain location: bilateral knee R>L Pain description: sharp Aggravating factors: prolonged walking, stairs, transfers Relieving factors: rest, ice  PRECAUTIONS: None  RED FLAGS: None   WEIGHT BEARING RESTRICTIONS: No  FALLS:  Has patient fallen in last 6 months? No  LIVING ENVIRONMENT: Lives with: lives with their family Lives in: House/apartment  OCCUPATION: Not currently working  PLOF: Independent  PATIENT GOALS: decrease knee pain, be able to be active with walking and exercise  NEXT MD VISIT: 11/09/2023   OBJECTIVE:  Note: Objective  measures were completed at Evaluation unless otherwise noted.  DIAGNOSTIC FINDINGS:  See imaging   PATIENT SURVEYS:  LEFS: 16/80 12/04/2023: 50/80  COGNITION: Overall cognitive status: Within functional limits for tasks assessed     SENSATION: WFL  POSTURE: rounded shoulders, forward head, and large body habitus  PALPATION: TTP to R patellar hypomobility, distal R quad   LOWER EXTREMITY ROM:  Active ROM Right eval Left eval  Hip flexion    Hip extension    Hip abduction    Hip adduction    Hip internal rotation    Hip external rotation    Knee flexion 120 125  Knee extension 0 0  Ankle dorsiflexion    Ankle plantarflexion    Ankle inversion    Ankle eversion     (Blank rows = not tested)  LOWER EXTREMITY MMT:  MMT Right eval Left eval Right 12/04/23 Left 12/04/23  Hip flexion 3+/5 4/5 4 4   Hip extension      Hip abduction 3+/5 3+/5 4 4   Hip adduction      Hip internal rotation      Hip external rotation      Knee flexion 5/5 5/5    Knee extension 5/5 5/5    Ankle dorsiflexion      Ankle plantarflexion      Ankle inversion      Ankle eversion       (Blank rows = not tested)  LOWER EXTREMITY SPECIAL TESTS:  Knee special tests: Lachman Test: negative  FUNCTIONAL TESTS:  30 Second Sit to Stand: 8 reps with UE 12/04/2023: 10 reps  GAIT: Distance walked: 76ft Assistive device utilized: None Level of assistance: Complete Independence Comments: antalgic gait R    TREATMENT: [facility] Adult PT Treatment:                                                DATE:12/04/23 Therapeutic Exercise:  Supine OS x 10 - 5" hold Supine SLR x 15 S/L hip abd x 15 S/L clamshell x 15 GTB Lateral walk YTB x 3 laps at counter Standing TKE RTB 2x15 - 5" hold R Therapeutic Activity: STS x 10 10# - low table Review of goals, tests/measures, and outcomes for discharge  [facility] Adult PT Treatment:                                                DATE: 11/26/23 Therapeutic Exercise:   Supine OS x 10 - 5" hold Supine SLR 2x15 S/L hip abd 2x15 Bridge with GTB 3x10  Hooklying clamshell 3x15 GTB LAQ 2x10 4# Standing TKE red power cord 2x15 - 5" hold R  Therapeutic Activity: STS 2x10 10# - low table Rec bike lvl 3 x 3 min for functional activity tolerance Functional squat  - 2x10 - B UE  [facility] Adult PT Treatment:                                                DATE: 11/25/23 Therapeutic Exercise:  Supine OS x 10 - 5" hold Supine SLR 2x10 2# each S/L hip abd 2x10 2# each Bridge with black 3x10  Hooklying clamshell 2x15 black band Lateral walk RTB x 3 laps at counter Standing hip abd 2x10 RTB Eccentric heel tap x 10 - R painful Standing TKE red power cord 2x15 - 5" hold R Therapeutic Activity: STS 2x10 10# - low table Step up 2x10 fwd R stance 8in - no UE Functional squat  - 2x10 - B UE   [facility] Adult PT Treatment:                                                DATE: 11/10/23 Therapeutic Exercise:  Supine OS x 10 - 5" hold Supine SLR 2x15 each McConnell tape with lateral glide bilaterally SAQ 2x10 2# S/L hip abd 2x15 Bridge with GTB 2x15  S/L clamshell 2x15 GTB Lateral walk YTB x 3 laps at counter Standing hip abd 2x10 YTB Standing TKE with ball x 10 - 5" hold R Therapeutic Activity: STS 2x10 - low table no UE Step up 2x10 fwd R stance 8in - no UE Functional squat  - 2x10 - B UE  PATIENT EDUCATION:  Education details: HEP Person educated: Patient Education method: Explanation, Demonstration, and Handouts Education comprehension: verbalized understanding and returned demonstration  HOME EXERCISE PROGRAM: Access Code: [email or web address] URL: [email or web address] Date: 11/03/2023 Prepared by: Loral Roch  Exercises - Supine Quadricep Sets  - 1 x daily - 7 x weekly - 2 sets - 10 reps - 5 sec hold - Active Straight Leg Raise with Quad Set  - 1 x daily - 7 x weekly - 3 sets - 15 reps - Sidelying Hip Abduction  - 1 x daily - 7 x weekly - 3 sets - 10  reps - Clamshell with Resistance  - 1 x daily - 7 x weekly - 3 sets - 15 reps - green band hold - Side Stepping with Resistance at Ankles and Counter Support  - 1 x daily - 7 x weekly - 3 reps - yellow band hold - Sit to Stand Without Arm Support  - 1 x daily - 7 x weekly - 2 sets - 10 reps  ASSESSMENT:  CLINICAL IMPRESSION: Pt was able to complete prescribed exercises and demonstrated knowledge of HEP. Over the course of PT treatment she has had some improvement in knee pain and LE strength. Subjectively noted increased function assessed via LEFS. Pt should maintain improvement with HEP compliance and will discharge from skilled therapy at this time.    EVAL: Patient is a 53 y.o. F who was seen today for physical therapy evaluation and treatment for chronic bilateral knee pain and discomfort. Physical findings are consistnet with physicain impression as pt demonstrates decrease in quad and proximal hip strength as well as decrease in functional mobility. Pt LEFS score shows moderate  disability in performance of home ADLs and higher level community activities. Pt would benefit from skilled PT services working on improving strength and functional mobility in order to decrease pain.    OBJECTIVE IMPAIRMENTS: decreased activity tolerance, decreased mobility, difficulty walking, decreased strength, and pain  ACTIVITY LIMITATIONS: sitting, standing, squatting, stairs, transfers, and locomotion level  PARTICIPATION LIMITATIONS: driving, shopping, community activity, occupation, and yard work  PERSONAL FACTORS: Time since onset of injury/illness/exacerbation are also affecting patient's functional outcome.   REHAB POTENTIAL: Good  CLINICAL DECISION MAKING: Stable/uncomplicated  EVALUATION COMPLEXITY: Low   GOALS: Goals reviewed with patient? No  SHORT TERM GOALS: Target date: 11/13/2023   Pt will be compliant and knowledgeable with initial HEP for improved comfort and carryover Baseline:  initial HEP given  Goal status: MET  2.  Pt will self report bilateral knee pain no greater than 6/10 for improved comfort and functional ability Baseline: 10/10 at worst    11/13/23: resting pain 6/10  Goal status: PARTIALLY MET  LONG TERM GOALS: Target date: 12/18/2023   Pt will improve LEFS to no less than 30/80 as proxy for functional improvement with home ADLs and higher level community activity Baseline: 16/80 12/04/2023: 50/80 Goal status: MET   2.  Pt will self report bilateral knee pain no greater than 3/10 for improved comfort and functional ability Baseline: 10/10 at worst 12/04/2023: 7/10 Goal status: NOT MET   3.  Pt will increase 30 Second Sit to Stand rep count to no less than 10 reps for improved balance, strength, and functional mobility Baseline: 8 reps with UE 12/04/2023: 10 reps Goal status: MET   4.  Pt will improve bilateral LE MMT to no less than 4/5 for all tested motions for improved functional mobility and decrease knee pain Baseline:  Goal status: INITIAL    PLAN:  PT FREQUENCY: 2x/week  PT DURATION: 8 weeks  PLANNED INTERVENTIONS: 97164- PT Re-evaluation, 97110-Therapeutic exercises, 97530- Therapeutic activity, [id number]- Neuromuscular re-education, 97535- Self Care, [id number]- Manual therapy, [id number]- Gait training, W2956- Electrical stimulation (unattended), [id number]- Electrical stimulation (manual), and Dry Needling  PLAN FOR NEXT SESSION: assess HEP response, eccentric quad and proximal hip strengthening, consider ionto   For all possible CPT codes, reference the Planned Interventions line above.     Check all conditions that are expected to impact treatment: {Conditions expected to impact treatment:Diabetes mellitus and Musculoskeletal disorders   If treatment provided at initial evaluation, no treatment charged due to lack of authorization.       Ivor Mars PT  12/04/23 10:51 AM

## 2023-12-10 ENCOUNTER — Telehealth: Payer: Self-pay

## 2023-12-10 ENCOUNTER — Other Ambulatory Visit (HOSPITAL_COMMUNITY): Payer: Self-pay

## 2023-12-10 NOTE — Progress Notes (Signed)
 Lauren Andersen D.[facility] [address] [place] [address] Phone: [phone]   Assessment and Plan:     1. Primary osteoarthritis of right knee 2. Chronic pain of right knee  -Chronic with exacerbation, subsequent visit - Recurrence in right knee pain consistent with recurrent flare of osteoarthritis - Patient received significant relief after intra-articular CSI on 09/18/2023, however pain has returned over the past several weeks.  Due to significant improvement with CSI, however relief not lasting 3 months, recommend transitioning to Zilretta  injections.  Zilretta  would also be beneficial with patient having past medical history of DM type II as blood glucose should be less effected compared to CSI.  We will order Zilretta  injections at today's visit and will contact patient once approved to follow-up for procedure only visit - Continue HEP and physical therapy - Continue Tylenol for day-to-day pain relief  Pertinent previous records reviewed include none  Follow Up: We will order Zilretta  injections at today's visit and will contact patient once approved to follow-up for procedure only visit.  Could further evaluate left knee pain with left knee x-ray and potential injections if pain continues   Subjective:   I, Lauren Andersen, am serving as a Neurosurgeon for Doctor Lauren Andersen   Chief Complaint: right knee pain    HPI:    03/31/22 Patient is a 53 year old female complaining of right knee pain. Patient states that she  has had pain since June , increased her walking , from being sedentary , no MOI, no locking clicking or popping, no numbness or tingling , took tylenol for the pain but it didn't really do much for the pain, pain walking up and down stair, knee feel likes its going to give out when she is picking up her leg    07/23/2022 Patient states want another injection, was taking meloxciam as needed    09/10/2022 Patient states that  her right knee is doing well a month ago she fell and hurt her left knee    10/09/2022 Patient states she has pain in the right knee she started walking in the morning and the pain came back , stopped taking meloxicam  and the pain was manageable but now that she is out she has pain    09/18/2023 Patient states knee pain and inflammation    10/12/2023 Patient states pain is back not as strong but still there. Took about a week to kick in   12/11/2023 Patient states she fell last week . And stopped PT a time constraint    Relevant Historical Information: None pertinent  Additional pertinent review of systems negative.   Current Outpatient Medications:    Multiple Vitamin (MULTIVITAMIN) tablet, Take 1 tablet by mouth daily., Disp: , Rfl:    Nutritional Supp - Diet Aids (FAT BURNER THERAPY PO), Take by mouth., Disp: , Rfl:    pantoprazole  (PROTONIX ) 20 MG tablet, Take 1 tablet (20 mg total) by mouth daily., Disp: 30 tablet, Rfl: 0   Objective:     Vitals:   12/11/23 1010  Pulse: 62  SpO2: 97%  Weight: 210 lb (95.3 kg)  Height: 5\' 2"  (1.575 m)      Body mass index is 38.41 kg/m.    Physical Exam:    General:  awake, alert oriented, no acute distress nontoxic Skin: no suspicious lesions or rashes Neuro:sensation intact, no deficits, strength 5/5 with no deficits, no atrophy, normal muscle tone Psych: No signs of anxiety, depression or  other mood disorder   Bilateral knee: Crepitus present No swelling No deformity Neg fluid wave, joint milking ROM Flex 100, Ext 10 Right TTP medial and lateral joint line, inferior patella, medial femoral condyle NTTP over the quad tendon,   lat fem condyle, patella, plica, patella tendon, tibial tuberostiy, fibular head, posterior fossa, pes anserine bursa, gerdy's tubercle,  Neg anterior and posterior drawer Neg lachman Neg sag sign Negative varus stress Negative valgus stress Negative McMurray for palpable pop, though reproduced pain on  right   Gait normal       Electronically signed by:  Lauren Andersen D.[facility] 12:11 PM 12/11/23

## 2023-12-10 NOTE — Telephone Encounter (Signed)
 Pharmacy Patient Advocate Encounter   Received notification from CoverMyMeds that prior authorization for Na Sulfate-K Sulfate-Mg Sulf 17.5-3.13-1.6GM/177ML solution is required/requested.   Insurance verification completed.   The patient is insured through [employer] / IT consultant .   Per test claim: PA required; PA submitted to above mentioned insurance via CoverMyMeds Key/confirmation #/EOC [id number] Status is pending

## 2023-12-11 ENCOUNTER — Ambulatory Visit (INDEPENDENT_AMBULATORY_CARE_PROVIDER_SITE_OTHER): Admitting: Sports Medicine

## 2023-12-11 VITALS — HR 62 | Ht 62.0 in | Wt 210.0 lb

## 2023-12-11 DIAGNOSIS — G8929 Other chronic pain: Secondary | ICD-10-CM | POA: Diagnosis not present

## 2023-12-11 DIAGNOSIS — M25561 Pain in right knee: Secondary | ICD-10-CM

## 2023-12-11 DIAGNOSIS — M1711 Unilateral primary osteoarthritis, right knee: Secondary | ICD-10-CM

## 2023-12-11 NOTE — Telephone Encounter (Signed)
 Left message via interpreter for patient to call back.

## 2023-12-11 NOTE — Telephone Encounter (Signed)
 Spoke with patient via interpreter & discussed denial for prep. New instructions have been sent to her mychart, and advised her to call back with any further questions/concerns. Pt verbalized all understanding.

## 2023-12-11 NOTE — Telephone Encounter (Signed)
 Pharmacy Patient Advocate Encounter  Received notification from [employer] / [employer] that Prior Authorization for  Na Sulfate-K Sulfate-Mg Sulf 17.5-3.13-1.6GM/177ML solution has been DENIED.  Full denial letter will be uploaded to the media tab. See denial reason below.  To meet our criteria:  You must first try one of the following preferred drug used to treat your condition: GaviLyte, PEG 3350-KCL-Na Bicarb-NaCl 420 GM, or PEG-3350/Electrolytes 236 GM  PA #/Case ID/Reference #: [id number]

## 2023-12-11 NOTE — Patient Instructions (Signed)
 Zilretta  R  We will call you when the injection is ready

## 2023-12-15 ENCOUNTER — Encounter: Payer: Self-pay | Admitting: Family Medicine

## 2023-12-15 ENCOUNTER — Ambulatory Visit: Payer: Medicaid Other | Admitting: Family Medicine

## 2023-12-15 ENCOUNTER — Ambulatory Visit: Payer: Self-pay | Admitting: Family Medicine

## 2023-12-15 VITALS — BP 112/74 | HR 62 | Temp 97.8°F | Ht 62.0 in | Wt 211.0 lb

## 2023-12-15 DIAGNOSIS — E785 Hyperlipidemia, unspecified: Secondary | ICD-10-CM | POA: Diagnosis not present

## 2023-12-15 DIAGNOSIS — E1169 Type 2 diabetes mellitus with other specified complication: Secondary | ICD-10-CM

## 2023-12-15 DIAGNOSIS — M6289 Other specified disorders of muscle: Secondary | ICD-10-CM

## 2023-12-15 DIAGNOSIS — T83711A Erosion of implanted vaginal mesh and other prosthetic materials to surrounding organ or tissue, initial encounter: Secondary | ICD-10-CM | POA: Diagnosis not present

## 2023-12-15 DIAGNOSIS — N941 Unspecified dyspareunia: Secondary | ICD-10-CM | POA: Diagnosis not present

## 2023-12-15 DIAGNOSIS — E669 Obesity, unspecified: Secondary | ICD-10-CM | POA: Diagnosis not present

## 2023-12-15 LAB — LIPID PANEL
Cholesterol: 174 mg/dL (ref 0–200)
HDL: 58 mg/dL (ref >39.00)
LDL Cholesterol: 98 mg/dL (ref 0–99)
NonHDL: 116.31
Total CHOL/HDL Ratio: 3
Triglycerides: 93 mg/dL (ref 0.0–149.0)
VLDL: 18.6 mg/dL (ref 0.0–40.0)

## 2023-12-15 LAB — MICROALBUMIN / CREATININE URINE RATIO
Creatinine,U: 102.5 mg/dL
Microalb Creat Ratio: UNDETERMINED mg/g (ref 0.0–30.0)
Microalb, Ur: <0.7 mg/dL

## 2023-12-15 LAB — TSH: TSH: 3.07 u[IU]/mL (ref 0.35–5.50)

## 2023-12-15 LAB — HEMOGLOBIN A1C: Hgb A1c MFr Bld: 6.3 % (ref 4.6–6.5)

## 2023-12-15 NOTE — Patient Instructions (Addendum)
 I referred you to the urologist at [facility] per your request.  Please follow-up with an OB/GYN for your Pap smear.  It is recommended that you get an eye exam every year with an ophthalmologist since you have diabetes.  Continue with a healthy diet and being active.  I will be in touch with your results.

## 2023-12-15 NOTE — Progress Notes (Signed)
 Subjective:     Patient ID: Lauren Andersen, female    DOB: 1971/04/04, 53 y.o.   MRN: [id number]  Chief Complaint  Patient presents with   Medical Management of Chronic Issues    3 month f/u,     HPI  History of Present Illness         She is here to follow-up on chronic health conditions.  She has lost weight.  States she is eating a healthier diet.  States she is eating less and working more. Works 4 pm - 1 am.   Declines medication for DM  States mammogram was done at [facility] and normal.  Scheduled colonoscopy   Requests referral to urologist at [facility] health.  She has seen them in the past and states she needed a referral to go back for possible mesh issue and history of surgery.    Health Maintenance Due  Topic Date Due   FOOT EXAM  Never done   OPHTHALMOLOGY EXAM  Never done   DTaP/Tdap/Td (1 - Tdap) Never done   Pneumococcal Vaccine 4-7 Years old (1 of 2 - PCV) Never done   Colonoscopy  Never done   MAMMOGRAM  08/09/2021   Cervical Cancer Screening (HPV/Pap Cotest)  03/25/2023    Past Medical History:  Diagnosis Date   Abdominal pain    Arthritis Dec 2023   Fatty liver 08/04/2010   Headache(784.0)    Hepatic steatosis     Past Surgical History:  Procedure Laterality Date   bladder mesh  2008   BLADDER SURGERY     CESAREAN SECTION     x2   colon mesh     RECTAL PROLAPSE REPAIR  2008   TUBAL LIGATION      Family History  Problem Relation Age of Onset   Thyroid disease Mother    Mental illness Mother        bipolar disorder, depression   Colon polyps Mother    Depression Mother    Cancer Maternal Aunt    Cancer Maternal Aunt    Breast cancer Cousin    Stomach cancer Neg Hx    Esophageal cancer Neg Hx    Colon cancer Neg Hx     Social History   Socioeconomic History   Marital status: Married    Spouse name: Charlcie Conger   Number of children: 3   Years of education: Not on file   Highest education level:  Bachelor's degree (e.g., BA, AB, BS)  Occupational History   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   Husband has a son from another partner, plus the three with the patient.   Social Drivers of Corporate investment banker Strain: Low Risk  (09/17/2023)   Overall Financial Resource Strain (CARDIA)    Difficulty of Paying Living Expenses: Not very hard  Food Insecurity: No Food Insecurity (09/17/2023)   Hunger Vital Sign    Worried About Running Out of Food in the Last Year: Never  true    Ran Out of Food in the Last Year: Never true  Transportation Needs: No Transportation Needs (09/17/2023)   PRAPARE - Administrator, Civil Service (Medical): No    Lack of Transportation (Non-Medical): No  Physical Activity: Insufficiently Active (09/17/2023)   Exercise Vital Sign    Days of Exercise per Week: 4 days    Minutes of Exercise per Session: 20 min  Stress: No Stress Concern Present (09/17/2023)   [employer] of Occupational Health - Occupational Stress Questionnaire    Feeling of Stress : Only a little  Social Connections: Socially Integrated (09/17/2023)   Social Connection and Isolation Panel [NHANES]    Frequency of Communication with Friends and Family: More than three times a week    Frequency of Social Gatherings with Friends and Family: Three times a week    Attends Religious Services: More than 4 times per year    Active Member of Clubs or Organizations: Yes    Attends Banker Meetings: More than 4 times per year    Marital Status: Married  Catering manager  Violence: Not on file    Outpatient Medications Prior to Visit  Medication Sig Dispense Refill   Multiple Vitamin (MULTIVITAMIN) tablet Take 1 tablet by mouth daily.     Nutritional Supp - Diet Aids (FAT BURNER THERAPY PO) Take by mouth.     pantoprazole  (PROTONIX ) 20 MG tablet Take 1 tablet (20 mg total) by mouth daily. (Patient not taking: Reported on 12/15/2023) 30 tablet 0   No facility-administered medications prior to visit.    No Known Allergies  Review of Systems  Constitutional:  Positive for malaise/fatigue and weight loss. Negative for chills and fever.       Intentional weight loss  Eyes:  Negative for blurred vision and double vision.  Respiratory:  Negative for shortness of breath.   Cardiovascular:  Negative for chest pain, palpitations and leg swelling.  Gastrointestinal:  Positive for abdominal pain. Negative for constipation, diarrhea, nausea and vomiting.  Genitourinary:  Negative for dysuria, frequency and urgency.  Neurological:  Negative for dizziness, tingling, focal weakness and headaches.       Objective:     Physical Exam Constitutional:      General: She is not in acute distress.    Appearance: She is not ill-appearing.  Eyes:     Extraocular Movements: Extraocular movements intact.     Conjunctiva/sclera: Conjunctivae normal.  Cardiovascular:     Rate and Rhythm: Normal rate.  Pulmonary:     Effort: Pulmonary effort is normal.  Musculoskeletal:     Cervical back: Normal range of motion and neck supple.  Skin:    General: Skin is warm and dry.  Neurological:     General: No focal deficit present.     Mental Status: She is alert and oriented to person, place, and time.  Psychiatric:        Mood and Affect: Mood normal.        Behavior: Behavior normal.        Thought Content: Thought content normal.      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 Check A1c, renal function, lipids and urine microalbumin. Reports new referral needed for her to follow up with urologist at [facility] who she has seen in the past. Referral made.  Recommend she follow up with OB/GYN.  Recommend eye exam.  Follow up in 4 months or sooner pending lab results.     I am having Lauren Andersen. Astroz Popayan maintain her multivitamin, Nutritional Supp - Diet Aids (FAT BURNER THERAPY PO), and pantoprazole .  No orders of the defined types were placed in this encounter.

## 2023-12-17 ENCOUNTER — Telehealth: Payer: Self-pay

## 2023-12-17 NOTE — Telephone Encounter (Signed)
 Can you schedule patient wen medication is in stock please   Zilretta  authorized for right knee Copay $40 Deducitble does not apply OOP MAX $3000 has met $ 287.49 Once OOP is met patient will be covered at 100% and copays will no longer apply PA # [id number] (patients Member ID) 12/16/23-01/16/24

## 2023-12-18 NOTE — Telephone Encounter (Signed)
 Scheduled

## 2023-12-21 NOTE — Progress Notes (Signed)
 Ben Knute Mazzuca D.[facility] [address] [place] [address] Phone: [phone]   Assessment and Plan:     1. Primary osteoarthritis of right knee 2. Chronic pain of right knee  -Chronic with exacerbation, subsequent visit - Recurrence of right knee pain consistent with recurrent flare of osteoarthritis - Patient received significant relief after intra-articular CSI on 09/18/2023, however relief only lasted about 2 months.  Patient has been approved for Zilretta  injection and elects to proceed with Zilretta  injection at today's visit.  Tolerated well per note below.  Zilretta  may temporarily increase blood glucose with patient having past medical history of DM type II, though increased should be decreased compared to CSI - Continue HEP and physical therapy - Continue Tylenol for day-to-day pain relief  Procedure: Knee Joint Injection Side: Right Indication: Flare of osteoarthritis  Risks explained and consent was given verbally. The site was cleaned with alcohol prep. A needle was introduced with an anterio-lateral approach. Injection given using 32 mg Zilretta . This was well tolerated and resulted in symptomatic relief.  Needle was removed, hemostasis achieved, and post injection instructions were explained.   Pt was advised to call or return to clinic if these symptoms worsen or fail to improve as anticipated.   Pertinent previous records reviewed include none  Follow Up: 3 to 4 weeks for reevaluation to ensure improvement.  If no significant improvement or worsening of symptoms, could consider HA injection versus advanced imaging versus orthopedic surgery referral   Subjective:   I, Leone Ralphs am a scribe for Dr. Cleora Daft.     Chief Complaint: right knee pain    HPI:    03/31/22 Patient is a 53 year old female complaining of right knee pain. Patient states that she  has had pain since June , increased her walking , from being  sedentary , no MOI, no locking clicking or popping, no numbness or tingling , took tylenol for the pain but it didn't really do much for the pain, pain walking up and down stair, knee feel likes its going to give out when she is picking up her leg    07/23/2022 Patient states want another injection, was taking meloxciam as needed    09/10/2022 Patient states that her right knee is doing well a month ago she fell and hurt her left knee    10/09/2022 Patient states she has pain in the right knee she started walking in the morning and the pain came back , stopped taking meloxicam  and the pain was manageable but now that she is out she has pain    09/18/2023 Patient states knee pain and inflammation    10/12/2023 Patient states pain is back not as strong but still there. Took about a week to kick in    12/11/2023 Patient states she fell last week . And stopped PT a time constraint   12/22/2023 Patient states has a lot of pain today. Definitely want the shot today.   Relevant Historical Information: None pertinent Additional pertinent review of systems negative.   Current Outpatient Medications:    Multiple Vitamin (MULTIVITAMIN) tablet, Take 1 tablet by mouth daily., Disp: , Rfl:    Nutritional Supp - Diet Aids (FAT BURNER THERAPY PO), Take by mouth., Disp: , Rfl:    pantoprazole  (PROTONIX ) 20 MG tablet, Take 1 tablet (20 mg total) by mouth daily. (Patient not taking: Reported on 12/22/2023), Disp: 30 tablet, Rfl: 0   Objective:     Vitals:  12/22/23 0924  Weight: 211 lb (95.7 kg)  Height: 5\' 2"  (1.575 m)      Body mass index is 38.59 kg/m.    Physical Exam:    General:  awake, alert oriented, no acute distress nontoxic Skin: no suspicious lesions or rashes Neuro:sensation intact, no deficits, strength 5/5 with no deficits, no atrophy, normal muscle tone Psych: No signs of anxiety, depression or other mood disorder   Bilateral knee: Crepitus present No swelling No  deformity Neg fluid wave, joint milking ROM Flex 100, Ext 10 Right TTP medial and lateral joint line, inferior patella, medial femoral condyle NTTP over the quad tendon,   lat fem condyle, patella, plica, patella tendon, tibial tuberostiy, fibular head, posterior fossa, pes anserine bursa, gerdy's tubercle,  Neg anterior and posterior drawer Neg lachman Neg sag sign Negative varus stress Negative valgus stress Negative McMurray for palpable pop, though reproduced pain on right   Gait normal    Electronically signed by:  Marshall Skeeter D.[facility] 9:32 AM 12/22/23

## 2023-12-22 ENCOUNTER — Ambulatory Visit (INDEPENDENT_AMBULATORY_CARE_PROVIDER_SITE_OTHER): Admitting: Sports Medicine

## 2023-12-22 VITALS — BP 110/60 | HR 78 | Ht 62.0 in | Wt 211.0 lb

## 2023-12-22 DIAGNOSIS — M1711 Unilateral primary osteoarthritis, right knee: Secondary | ICD-10-CM | POA: Diagnosis not present

## 2023-12-22 DIAGNOSIS — G8929 Other chronic pain: Secondary | ICD-10-CM

## 2023-12-22 DIAGNOSIS — M25561 Pain in right knee: Secondary | ICD-10-CM | POA: Diagnosis not present

## 2023-12-22 MED ORDER — TRIAMCINOLONE ACETONIDE 32 MG IX SRER
32.0000 mg | Freq: Once | INTRA_ARTICULAR | Status: AC
  Administered 2023-12-22: 32 mg via INTRA_ARTICULAR

## 2023-12-22 NOTE — Patient Instructions (Addendum)
 Zilretta  injection today. Light duty today and tomorrow. Follow up in clinic 3 to 4 weeks.

## 2024-01-04 ENCOUNTER — Encounter: Payer: Self-pay | Admitting: Internal Medicine

## 2024-01-04 ENCOUNTER — Ambulatory Visit: Admitting: Internal Medicine

## 2024-01-04 VITALS — BP 124/66 | HR 66 | Temp 98.4°F | Resp 12 | Ht 62.0 in | Wt 214.0 lb

## 2024-01-04 DIAGNOSIS — D123 Benign neoplasm of transverse colon: Secondary | ICD-10-CM | POA: Diagnosis not present

## 2024-01-04 DIAGNOSIS — R1011 Right upper quadrant pain: Secondary | ICD-10-CM

## 2024-01-04 DIAGNOSIS — B9681 Helicobacter pylori [H. pylori] as the cause of diseases classified elsewhere: Secondary | ICD-10-CM

## 2024-01-04 DIAGNOSIS — D122 Benign neoplasm of ascending colon: Secondary | ICD-10-CM

## 2024-01-04 DIAGNOSIS — K2980 Duodenitis without bleeding: Secondary | ICD-10-CM

## 2024-01-04 DIAGNOSIS — K449 Diaphragmatic hernia without obstruction or gangrene: Secondary | ICD-10-CM | POA: Diagnosis not present

## 2024-01-04 DIAGNOSIS — K648 Other hemorrhoids: Secondary | ICD-10-CM

## 2024-01-04 DIAGNOSIS — K2950 Unspecified chronic gastritis without bleeding: Secondary | ICD-10-CM

## 2024-01-04 DIAGNOSIS — Z1211 Encounter for screening for malignant neoplasm of colon: Secondary | ICD-10-CM

## 2024-01-04 MED ORDER — PANTOPRAZOLE SODIUM 40 MG PO TBEC: 40.0000 mg | DELAYED_RELEASE_TABLET | Freq: Every day | ORAL | 3 refills | Status: DC

## 2024-01-04 MED ORDER — SODIUM CHLORIDE 0.9 % IV SOLN: 500.0000 mL | INTRAVENOUS | Status: DC

## 2024-01-04 NOTE — Progress Notes (Signed)
 Pt's states no medical or surgical changes since previsit or office visit.   Interpreter used today at the [facility] for this pt.  Interpreter's name is Angelique

## 2024-01-04 NOTE — Progress Notes (Signed)
 Called to room to assist during endoscopic procedure.  Patient ID and intended procedure confirmed with present staff. Received instructions for my participation in the procedure from the performing physician.

## 2024-01-04 NOTE — Patient Instructions (Addendum)
 Resume previous diet Continue present medications Start Pantoprazole  40 mg daily  Office visit Friday 03/18/24 with Dr Rosaline Coma 2nd floor at 910 AM Await pathology results  Handouts/information given for Pantoprazole , Gastritis, hiatal hernia, colon polyps and hemorrhoids  USTED TUVO UN PROCEDIMIENTO ENDOSCPICO HOY EN EL [facility]:   Lea el informe del procedimiento que se le entreg para cualquier pregunta especfica sobre lo que se Dentist.  Si el informe del examen no responde a sus preguntas, por favor llame a su gastroenterlogo para aclararlo.  Si usted solicit que no se le den [employer] de lo que se Clinical cytogeneticist en su procedimiento al [employer] va a cuidar, entonces el informe del procedimiento se ha incluido en un sobre sellado para que usted lo revise despus cuando le sea ms conveniente.   LO QUE PUEDE ESPERAR: Algunas sensaciones de hinchazn en el abdomen.  Puede tener ms gases de lo normal.  El caminar puede ayudarle a eliminar el aire que se le puso en el tracto gastrointestinal durante el procedimiento y reducir la hinchazn.  Si le hicieron una endoscopia inferior (como una colonoscopia o una sigmoidoscopia flexible), podra notar manchas de sangre en las heces fecales o en el papel higinico.  Si se someti a una preparacin intestinal para su procedimiento, es posible que no tenga una evacuacin intestinal normal durante [employer].   Tenga en cuenta:  Es posible que note un poco de irritacin y congestin en la nariz o algn drenaje.  Esto es debido al oxgeno [employer] durante su procedimiento.  No hay que preocuparse y esto debe desaparecer ms o Regulatory affairs officer.   SNTOMAS PARA REPORTAR INMEDIATAMENTE:  Despus de una endoscopia inferior (colonoscopia):  Cantidades excesivas de sangre en las heces fecales  Sensibilidad significativa o empeoramiento de los dolores abdominales   Hinchazn aguda del abdomen que antes no tena   Fiebre de  100F o ms Despus de la endoscopia superior (EGD)  Vmitos de Retail buyer o material como caf molido   Dolor en el pecho o dolor debajo de los omplatos que antes no tena   Dolor o dificultad persistente para tragar  Falta de aire que antes no tena   Heces fecales negras y pegajosas   Para asuntos urgentes o de Associate Professor, puede comunicarse con un gastroenterlogo a cualquier hora llamando al [phone].  DIETA:  Recomendamos una comida pequea al principio, pero luego puede continuar con su dieta normal.  Tome muchos lquidos, pero debe evitar las bebidas alcohlicas durante 24 horas.    ACTIVIDAD:  Debe planear tomarse las cosas con calma por el resto del da y no debe CONDUCIR ni usar maquinaria pesada Patent examiner (debido a los medicamentos de sedacin utilizados durante el examen).     SEGUIMIENTO: Nuestro personal llamar al nmero que aparece en su historial al siguiente da hbil de su procedimiento para ver cmo se siente y para responder cualquier pregunta o inquietud que pueda tener con respecto a la informacin que se le dio despus del procedimiento. Si no podemos contactarle, le dejaremos un mensaje.  Sin embargo, si se siente bien y no tiene English as a second language teacher, no es necesario que nos devuelva la llamada.  Asumiremos que ha regresado a sus actividades diarias normales sin incidentes. Si se le tomaron algunas biopsias, le contactaremos por telfono o por carta en las prximas 3 semanas.  Si no ha sabido [employer] biopsias en el transcurso de 3 semanas, por favor llmenos al [phone].   FIRMAS/CONFIDENCIALIDAD: Usted y/o el acompaante que le cuide han firmado documentos que se ingresarn en su historial mdico electrnico.  Estas firmas atestiguan el hecho de que la informacin anterior

## 2024-01-04 NOTE — Progress Notes (Signed)
 GASTROENTEROLOGY PROCEDURE H&P NOTE   Primary Care Physician: Abram Abraham, NP-C    Reason for Procedure:   RUQ ab pain, colon cancer screening  Plan:    EGD/colonoscopy  Patient is appropriate for endoscopic procedure(s) in the ambulatory ([facility]) setting.  The nature of the procedure, as well as the risks, benefits, and alternatives were carefully and thoroughly reviewed with the patient. Ample time for discussion and questions allowed. The patient understood, was satisfied, and agreed to proceed.     HPI: Lauren Andersen is a 53 y.o. female who presents for EGD/colonoscopy for evaluation of RUQ ab pain, colon cancer screening.  Patient was most recently seen in the Gastroenterology Clinic on 11/30/23.  No interval change in medical history since that appointment. Please refer to that note for full details regarding GI history and clinical presentation.   Past Medical History:  Diagnosis Date   Abdominal pain    Arthritis Dec 2023   Fatty liver 08/04/2010   GERD (gastroesophageal reflux disease)    Headache(784.0)    Hepatic steatosis     Past Surgical History:  Procedure Laterality Date   bladder mesh  2008   BLADDER SURGERY     CESAREAN SECTION     x2   colon mesh     RECTAL PROLAPSE REPAIR  2008   TUBAL LIGATION      Prior to Admission medications   Medication Sig Start Date End Date Taking? Authorizing Provider  Multiple Vitamin (MULTIVITAMIN) tablet Take 1 tablet by mouth daily.    [provider]  Nutritional Supp - Diet Aids (FAT BURNER THERAPY PO) Take by mouth.    [provider]  pantoprazole  (PROTONIX ) 20 MG tablet Take 1 tablet (20 mg total) by mouth daily. Patient not taking: Reported on 12/22/2023 11/30/23   Edmonia Gottron, PA-C    Current Outpatient Medications  Medication Sig Dispense Refill   Multiple Vitamin (MULTIVITAMIN) tablet Take 1 tablet by mouth daily.     pantoprazole  (PROTONIX ) 20 MG tablet Take 1  tablet (20 mg total) by mouth daily. 30 tablet 0   Nutritional Supp - Diet Aids (FAT BURNER THERAPY PO) Take by mouth.     Current Facility-Administered Medications  Medication Dose Route Frequency Provider Last Rate Last Admin   0.9 %  sodium chloride infusion  500 mL Intravenous Continuous Daina Drum, MD        Allergies as of 01/04/2024   (No Known Allergies)    Family History  Problem Relation Age of Onset   Thyroid  disease Mother    Mental illness Mother        bipolar disorder, depression   Colon polyps Mother    Depression Mother    Cancer Maternal Aunt    Cancer Maternal Aunt    Breast cancer Cousin    Stomach cancer Neg Hx    Esophageal cancer Neg Hx    Colon cancer Neg Hx     Social History   Socioeconomic History   Marital status: Married    Spouse name: Charlcie Conger   Number of children: 3   Years of education: Not on file   Highest education level: Bachelor's degree (e.g., BA, AB, BS)  Occupational History   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   Husband has a son from another partner, plus the three with the patient.   Social Drivers of Corporate investment banker Strain: Low Risk  (09/17/2023)   Overall Financial Resource Strain (CARDIA)    Difficulty of Paying Living Expenses: Not very hard  Food Insecurity: No Food Insecurity (09/17/2023)   Hunger Vital Sign    Worried About Running Out of Food in the Last Year: Never true    Ran Out of Food in the Last Year: Never true  Transportation Needs: No Transportation Needs (09/17/2023)   PRAPARE - Therapist, art (Medical): No    Lack of Transportation (Non-Medical): No  Physical Activity: Insufficiently Active (09/17/2023)   Exercise Vital Sign    Days of Exercise per Week: 4 days    Minutes of Exercise per Session: 20 min  Stress: No Stress Concern Present (09/17/2023)   [employer] of Occupational Health - Occupational Stress Questionnaire    Feeling of Stress : Only a little  Social Connections: Socially Integrated (09/17/2023)   Social Connection and Isolation Panel [NHANES]    Frequency of Communication with Friends and Family: More than three times a week    Frequency of Social Gatherings with Friends and Family: Three times a week    Attends Religious Services: More than 4 times per year    Active Member of Clubs or Organizations: Yes    Attends Engineer, structural: More than 4 times per year    Marital Status: Married  Catering manager Violence: Not on file    Physical Exam: Vital signs in last 24 hours: BP 123/76   Pulse 65   Temp 98.4 F (36.9 C)   Ht 5\' 2"  (1.575 m)   Wt 214 lb (97.1 kg)   SpO2 95%   BMI 39.14 kg/m  GEN: NAD EYE: Sclerae anicteric ENT: MMM CV: Non-tachycardic Pulm: No increased WOB GI: Soft NEURO:  Alert & Oriented   Regino Caprio, MD [facility]   01/04/2024 1:59 PM

## 2024-01-04 NOTE — Op Note (Signed)
  [facility] Patient Name: Arthea Nobel Popayan Procedure Date: 01/04/2024 2:08 PM MRN: [id number] Endoscopist: Freada Jacobs [place] , , [id number] Age: 53 Referring MD:  Date of Birth: May 18, 1971 Gender: Female Account #: [id number] Procedure:                Upper GI endoscopy Indications:              Abdominal pain in the right upper quadrant, Nausea                            with vomiting Medicines:                Monitored Anesthesia Care Procedure:                Pre-Anesthesia Assessment:                           - Prior to the procedure, a History and Physical                            was performed, and patient medications and                            allergies were reviewed. The patient's tolerance of                            previous anesthesia was also reviewed. The risks                            and benefits of the procedure and the sedation                            options and risks were discussed with the patient.                            All questions were answered, and informed consent                            was obtained. Prior Anticoagulants: The patient has                            taken no anticoagulant or antiplatelet agents. ASA                            Grade Assessment: II - A patient with mild systemic                            disease. After reviewing the risks and benefits,                            the patient was deemed in satisfactory condition to                            undergo the procedure.  After obtaining informed consent, the endoscope was                            passed under direct vision. Throughout the                            procedure, the patient's blood pressure, pulse, and                            oxygen saturations were monitored continuously. The                            Olympus Scope SN [id number] was introduced through the                            mouth, and advanced to the  second part of duodenum.                            The upper GI endoscopy was accomplished without                            difficulty. The patient tolerated the procedure                            well. Scope In: Scope Out: Findings:                 The examined esophagus was normal.                           A medium-sized hiatal hernia was present.                           Localized inflammation characterized by congestion                            (edema), erosions and erythema was found in the                            gastric fundus and in the gastric antrum. Biopsies                            were taken with a cold forceps for histology.                           The examined duodenum was normal. Biopsies were                            taken with a cold forceps for histology. Complications:            No immediate complications. Estimated Blood Loss:     Estimated blood loss was minimal. Impression:               - Normal esophagus.                           -  Medium-sized hiatal hernia.                           - Gastritis. Biopsied.                           - Normal examined duodenum. Biopsied. Recommendation:           - Await pathology results.                           - Start pantoprazole  40 mg daily.                           - Return to GI clinic in 2-3 months.                           - Perform a colonoscopy today. Dr Pedro Bourgeois "[name]" [place],  01/04/2024 2:47:02 PM

## 2024-01-04 NOTE — Progress Notes (Signed)
 Sedate, gd SR, tolerated procedure well, VSS, report to RN

## 2024-01-04 NOTE — Op Note (Signed)
 [facility] Patient Name: Lauren Andersen Procedure Date: 01/04/2024 2:08 PM MRN: [id number] Endoscopist: Freada Jacobs [place] , , [id number] Age: 53 Referring MD:  Date of Birth: August 08, 1970 Gender: Female Account #: [id number] Procedure:                Colonoscopy Indications:              Screening for colorectal malignant neoplasm, This                            is the patient's first colonoscopy Medicines:                Monitored Anesthesia Care Procedure:                Pre-Anesthesia Assessment:                           - Prior to the procedure, a History and Physical                            was performed, and patient medications and                            allergies were reviewed. The patient's tolerance of                            previous anesthesia was also reviewed. The risks                            and benefits of the procedure and the sedation                            options and risks were discussed with the patient.                            All questions were answered, and informed consent                            was obtained. Prior Anticoagulants: The patient has                            taken no anticoagulant or antiplatelet agents. ASA                            Grade Assessment: II - A patient with mild systemic                            disease. After reviewing the risks and benefits,                            the patient was deemed in satisfactory condition to                            undergo the procedure.  After obtaining informed consent, the colonoscope                            was passed under direct vision. Throughout the                            procedure, the patient's blood pressure, pulse, and                            oxygen saturations were monitored continuously. The                            Olympus [id number] ([id number]) Colonoscope was                            introduced through  the anus and advanced to the the                            terminal ileum. The colonoscopy was performed                            without difficulty. The patient tolerated the                            procedure well. The quality of the bowel                            preparation was good. The terminal ileum, ileocecal                            valve, appendiceal orifice, and rectum were                            photographed. Scope In: 2:25:21 PM Scope Out: 2:44:03 PM Scope Withdrawal Time: 0 hours 15 minutes 22 seconds  Total Procedure Duration: 0 hours 18 minutes 42 seconds  Findings:                 The terminal ileum appeared normal.                           Two sessile polyps were found in the transverse                            colon and ascending colon. The polyps were 3 to 5                            mm in size. These polyps were removed with a cold                            snare. Resection and retrieval were complete.                           Non-bleeding internal hemorrhoids were found during  retroflexion. Complications:            No immediate complications. Estimated Blood Loss:     Estimated blood loss was minimal. Impression:               - The examined portion of the ileum was normal.                           - Two 3 to 5 mm polyps in the transverse colon and                            in the ascending colon, removed with a cold snare.                            Resected and retrieved.                           - Non-bleeding internal hemorrhoids. Recommendation:           - Discharge patient to home (with escort).                           - Await pathology results.                           - The findings and recommendations were discussed                            with the patient. Dr Lauren Andersen "Lauren Andersen" [place],  01/04/2024 2:48:29 PM

## 2024-01-05 ENCOUNTER — Telehealth: Payer: Self-pay

## 2024-01-05 NOTE — Telephone Encounter (Signed)
  Follow up Call-     01/04/2024    1:34 PM  Call back number  Post procedure Call Back phone  # [phone]  Permission to leave phone message Yes     Patient questions:  Do you have a fever, pain , or abdominal swelling? No. Pain Score  0 *  Have you tolerated food without any problems? Yes.    Have you been able to return to your normal activities? Yes.    Do you have any questions about your discharge instructions: Diet   No. Medications  No. Follow up visit  No.  Do you have questions or concerns about your Care? No.  Actions: * If pain score is 4 or above: No action needed, pain <4.

## 2024-01-12 LAB — SURGICAL PATHOLOGY

## 2024-01-13 ENCOUNTER — Ambulatory Visit: Payer: Self-pay | Admitting: Internal Medicine

## 2024-01-13 NOTE — Progress Notes (Signed)
 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 Recommend bismuth quadruple therapy for treatment: - Tetracycline 500 mg QID x 14 days - Flagyl 250 mg QID x 14 days - Bismuth subsalicylate 524 mg QID x 14 days - PPI BID x 14 days  I will plan to test for eradication of H pylori during her follow up appointment.

## 2024-01-14 ENCOUNTER — Other Ambulatory Visit: Payer: Self-pay

## 2024-01-14 MED ORDER — TETRACYCLINE HCL 500 MG PO CAPS: 500.0000 mg | ORAL_CAPSULE | Freq: Four times a day (QID) | ORAL | 0 refills | Status: AC

## 2024-01-14 MED ORDER — METRONIDAZOLE 250 MG PO TABS
250.0000 mg | ORAL_TABLET | Freq: Four times a day (QID) | ORAL | 0 refills | Status: AC
Start: 2024-01-14 — End: 2024-01-28

## 2024-01-14 MED ORDER — BISMUTH SUBSALICYLATE 262 MG PO CHEW: 524.0000 mg | CHEWABLE_TABLET | Freq: Four times a day (QID) | ORAL | 0 refills | Status: AC

## 2024-01-14 NOTE — Progress Notes (Signed)
 Ben Shaunita Seney D.[facility] [address] [place] [address] Phone: [phone]   Assessment and Plan:     1. Primary osteoarthritis of right knee 2. Chronic pain of right knee  -Chronic with exacerbation, subsequent visit - Continued right knee pain consistent with flare of osteoarthritis.  Minimal relief with Zilretta  injection performed at previous office visit on 12/22/2023 - Patient would like to try HA injection.  Will order prior authorization and follow-up with patient's when she is ready to try next injection - Start Tylenol 500 to 1000 mg tablets 2-3 times a day for day-to-day pain relief as needed - Use meloxicam  15 mg daily as needed for pain.  Recommend limiting chronic NSAIDs to 1-2 doses per week to prevent long-term side effects.  - Continue HEP and physical therapy - Handout provided on GAE procedure that patient could further consider  Pertinent previous records reviewed include none  Follow Up: Patient to call and schedule appointment once HA injection has been approved   Subjective:   I, Adrienne Horning, am serving as a scribe for Dr. Ulysees Gander  Chief Complaint: right knee pain   HPI:   03/31/22 Patient is a 53 year old female complaining of right knee pain. Patient states that she  has had pain since June , increased her walking , from being sedentary , no MOI, no locking clicking or popping, no numbness or tingling , took tylenol for the pain but it didn't really do much for the pain, pain walking up and down stair, knee feel likes its going to give out when she is picking up her leg    07/23/2022 Patient states want another injection, was taking meloxciam as needed    09/10/2022 Patient states that her right knee is doing well a month ago she fell and hurt her left knee    10/09/2022 Patient states she has pain in the right knee she started walking in the morning and the pain came back , stopped taking meloxicam   and the pain was manageable but now that she is out she has pain    09/18/2023 Patient states knee pain and inflammation    10/12/2023 Patient states pain is back not as strong but still there. Took about a week to kick in    12/11/2023 Patient states she fell last week . And stopped PT a time constraint    12/22/2023 Patient states has a lot of pain today. Definitely want the shot today.  01/15/24 Last visit paitnet had a right knee zilretta  injection. Patient states the knees are a little bit better but not a lot.   Relevant Historical Information: none pertinent   Additional pertinent review of systems negative.   Current Outpatient Medications:    bismuth  subsalicylate (PEPTO BISMOL) 262 MG chewable tablet, Chew 2 tablets (524 mg total) by mouth in the morning, at noon, in the evening, and at bedtime for 14 days., Disp: 112 tablet, Rfl: 0   metroNIDAZOLE  (FLAGYL ) 250 MG tablet, Take 1 tablet (250 mg total) by mouth 4 (four) times daily for 14 days., Disp: 56 tablet, Rfl: 0   Multiple Vitamin (MULTIVITAMIN) tablet, Take 1 tablet by mouth daily., Disp: , Rfl:    Nutritional Supp - Diet Aids (FAT BURNER THERAPY PO), Take by mouth., Disp: , Rfl:    pantoprazole  (PROTONIX ) 40 MG tablet, Take 1 tablet (40 mg total) by mouth daily., Disp: 90 tablet, Rfl: 3   tetracycline  (SUMYCIN ) 500 MG capsule,  Take 1 capsule (500 mg total) by mouth 4 (four) times daily for 14 days., Disp: 56 capsule, Rfl: 0   Objective:     Vitals:   01/15/24 1032  BP: 120/60  Pulse: 86  SpO2: 97%  Weight: 206 lb (93.4 kg)  Height: 5' 2 (1.575 m)      Body mass index is 37.68 kg/m.    Physical Exam:    General:  awake, alert oriented, no acute distress nontoxic Skin: no suspicious lesions or rashes Neuro:sensation intact, no deficits, strength 5/5 with no deficits, no atrophy, normal muscle tone Psych: No signs of anxiety, depression or other mood disorder   Bilateral knee: Crepitus present No  swelling No deformity Neg fluid wave, joint milking ROM Flex 100, Ext 10 Right TTP medial and lateral joint line, inferior patella, medial femoral condyle NTTP over the quad tendon,   lat fem condyle, patella, plica, patella tendon, tibial tuberostiy, fibular head, posterior fossa, pes anserine bursa, gerdy's tubercle,  Neg anterior and posterior drawer Neg lachman Neg sag sign Negative varus stress Negative valgus stress Negative McMurray for palpable pop, though reproduced pain on right   Gait normal     Electronically signed by:  Marshall Skeeter D.[facility] 10:59 AM 01/15/24

## 2024-01-15 ENCOUNTER — Telehealth: Payer: Self-pay

## 2024-01-15 ENCOUNTER — Ambulatory Visit (INDEPENDENT_AMBULATORY_CARE_PROVIDER_SITE_OTHER): Admitting: Sports Medicine

## 2024-01-15 VITALS — BP 120/60 | HR 86 | Ht 62.0 in | Wt 206.0 lb

## 2024-01-15 DIAGNOSIS — M25561 Pain in right knee: Secondary | ICD-10-CM | POA: Diagnosis not present

## 2024-01-15 DIAGNOSIS — G8929 Other chronic pain: Secondary | ICD-10-CM

## 2024-01-15 DIAGNOSIS — M1711 Unilateral primary osteoarthritis, right knee: Secondary | ICD-10-CM

## 2024-01-15 NOTE — Patient Instructions (Addendum)
 Get gel approval for right knee. Follow up as needed.  [facility] information given today.

## 2024-01-15 NOTE — Telephone Encounter (Signed)
 Patient has been ran for Monov[employer] for right knee on 01/15/24. Case #: [id number] Pending approval.

## 2024-01-20 ENCOUNTER — Telehealth: Payer: Self-pay

## 2024-01-20 NOTE — Telephone Encounter (Signed)
 [employer] rep called stating that they were having issues verifying patient's insurance plan so that they can run her for benefits. Spoke with Ammon Bales. She gave me the patient's name and date of birth. The insurance card does not have the last name Popayan on it. However, the insurance member ID number matches. The rep stated that she would run the patient without the Popayan last name and see if that will go thorough.

## 2024-01-26 NOTE — Telephone Encounter (Signed)
 Can you see if this patients insurance will be $4 dollars through the pharmacy? Please

## 2024-01-27 ENCOUNTER — Other Ambulatory Visit: Payer: Self-pay

## 2024-01-27 ENCOUNTER — Other Ambulatory Visit (HOSPITAL_COMMUNITY): Payer: Self-pay

## 2024-01-27 NOTE — Telephone Encounter (Signed)
 Hey Bri, do you still need me to do this or did you reach out yesterday?

## 2024-02-01 NOTE — Telephone Encounter (Signed)
 Will call on insurance tomorrow received a fax back from [employer] stating no coverage found and to contact number on patients  insurance card. Will do this tomorrow, as everywhere I try something it says patient not found.

## 2024-02-08 NOTE — Telephone Encounter (Signed)
 Still having trouble with patients insurance I was directed to another provider portal to help. [email or web address], will register through here and try to run patient again

## 2024-02-23 NOTE — Telephone Encounter (Signed)
 I believe that [employer] follows [place] Medicaid guidelines.

## 2024-02-23 NOTE — Telephone Encounter (Signed)
 No matter what I have tried I cannot get this patient approved. Not sure if there is something on your end you know that I dont

## 2024-02-24 NOTE — Telephone Encounter (Signed)
 To the best of my knowledge, they do not cover gel shots but have covered Zilretta  in the past under pharmacy benefits.

## 2024-02-29 NOTE — Telephone Encounter (Signed)
 Scheduled

## 2024-03-11 NOTE — Progress Notes (Signed)
 [facility] [address] [place] [address] Phone: [phone]   Assessment and Plan:     1. Primary osteoarthritis of right knee 2. Chronic pain of right knee  -Chronic with exacerbation, subsequent visit - Continued right knee pain consistent with flare of osteoarthritis.  Patient has had minimal relief with CSI or Zilretta  injections with last performed on 12/22/2023. - I feel patient would be a good candidate for HA injection.  Unfortunately, patient's insurance is denying HA injections, so we are limited in our conservative treatment options. - Offered neck steps in treatment plan which could include referral for fitted knee brace versus repeat CSI versus referral for GAE procedure versus referral to orthopedic surgery.  Patient is not interested in pursuing any of these options currently due to other current medical workup.  After other vital workup, patient will reach back out to our clinic and ask for any of the above treatment options  Pertinent previous records reviewed include none  Follow Up: As needed . Offered neck steps in treatment plan which could include referral for fitted knee brace versus repeat CSI versus referral for GAE procedure versus referral to orthopedic surgery.  Patient is not interested in pursuing any of these options currently due to other current medical workup.  After other vital workup, patient will reach back out to our clinic and ask for any of the above treatment options   Subjective:   I, Lauren Andersen, am serving as a Neurosurgeon for Doctor Morene Mace  Chief Complaint: right knee pain    HPI:    03/31/22 Patient is a 53 year old female complaining of right knee pain. Patient states that she  has had pain since June , increased her walking , from being sedentary , no MOI, no locking clicking or popping, no numbness or tingling , took tylenol for the pain but it didn't really do much for  the pain, pain walking up and down stair, knee feel likes its going to give out when she is picking up her leg    07/23/2022 Patient states want another injection, was taking meloxciam as needed    09/10/2022 Patient states that her right knee is doing well a month ago she fell and hurt her left knee    10/09/2022 Patient states she has pain in the right knee she started walking in the morning and the pain came back , stopped taking meloxicam  and the pain was manageable but now that she is out she has pain    09/18/2023 Patient states knee pain and inflammation    10/12/2023 Patient states pain is back not as strong but still there. Took about a week to kick in    12/11/2023 Patient states she fell last week . And stopped PT a time constraint    12/22/2023 Patient states has a lot of pain today. Definitely want the shot today.   01/15/24 Last visit paitnet had a right knee zilretta  injection. Patient states the knees are a little bit better but not a lot.   03/14/2024 Patient states pain is still there. Zilretta  didn't really help  Relevant Historical Information: none pertinent   Additional pertinent review of systems negative.   Current Outpatient Medications:    Multiple Vitamin (MULTIVITAMIN) tablet, Take 1 tablet by mouth daily., Disp: , Rfl:    Nutritional Supp - Diet Aids (FAT BURNER THERAPY PO), Take by mouth., Disp: , Rfl:    pantoprazole  (PROTONIX ) 40  MG tablet, Take 1 tablet (40 mg total) by mouth daily., Disp: 90 tablet, Rfl: 3   Objective:     Vitals:   03/14/24 1411  Pulse: 79  SpO2: 98%  Weight: 199 lb (90.3 kg)  Height: 5' 2 (1.575 m)      Body mass index is 36.4 kg/m.    Physical Exam:    General:  awake, alert oriented, no acute distress nontoxic Skin: no suspicious lesions or rashes Neuro:sensation intact, no deficits, strength 5/5 with no deficits, no atrophy, normal muscle tone Psych: No signs of anxiety, depression or other mood disorder    Bilateral knee: Crepitus present No swelling No deformity Neg fluid wave, joint milking ROM Flex 100, Ext 10 Right TTP medial and lateral joint line, inferior patella, medial femoral condyle NTTP over the quad tendon,   lat fem condyle, patella, plica, patella tendon, tibial tuberostiy, fibular head, posterior fossa, pes anserine bursa, gerdy's tubercle,  Neg anterior and posterior drawer Neg lachman Neg sag sign Negative varus stress Negative valgus stress Negative McMurray for palpable pop, though reproduced pain on right   Gait normal     Electronically signed by:  Odis Mace D.[facility] 2:34 PM 03/14/24

## 2024-03-14 ENCOUNTER — Ambulatory Visit: Admitting: Sports Medicine

## 2024-03-14 VITALS — HR 79 | Ht 62.0 in | Wt 199.0 lb

## 2024-03-14 DIAGNOSIS — M1711 Unilateral primary osteoarthritis, right knee: Secondary | ICD-10-CM | POA: Diagnosis not present

## 2024-03-14 DIAGNOSIS — M25561 Pain in right knee: Secondary | ICD-10-CM

## 2024-03-14 DIAGNOSIS — G8929 Other chronic pain: Secondary | ICD-10-CM

## 2024-03-14 NOTE — Patient Instructions (Signed)
 As needed follow up   Call and ask for repeat injection or a referral for a knee brace Or referral for the GAE (Geniculate artery embolization)  procedure  , or referral to ortho for knee replacement   Tylenol [id number] mg 2-3 times a day for pain relief   - Use meloxicam  15 mg daily as needed for pain.  Recommend limiting chronic NSAIDs to 1-2 doses per week to prevent long-term side effects.

## 2024-03-18 ENCOUNTER — Encounter: Payer: Self-pay | Admitting: Internal Medicine

## 2024-03-18 ENCOUNTER — Ambulatory Visit (INDEPENDENT_AMBULATORY_CARE_PROVIDER_SITE_OTHER): Admitting: Internal Medicine

## 2024-03-18 VITALS — BP 118/80 | HR 63 | Ht 62.0 in | Wt 201.0 lb

## 2024-03-18 DIAGNOSIS — K219 Gastro-esophageal reflux disease without esophagitis: Secondary | ICD-10-CM

## 2024-03-18 DIAGNOSIS — K760 Fatty (change of) liver, not elsewhere classified: Secondary | ICD-10-CM

## 2024-03-18 DIAGNOSIS — K2970 Gastritis, unspecified, without bleeding: Secondary | ICD-10-CM | POA: Diagnosis not present

## 2024-03-18 DIAGNOSIS — A048 Other specified bacterial intestinal infections: Secondary | ICD-10-CM

## 2024-03-18 DIAGNOSIS — R1011 Right upper quadrant pain: Secondary | ICD-10-CM | POA: Diagnosis not present

## 2024-03-18 DIAGNOSIS — B9681 Helicobacter pylori [H. pylori] as the cause of diseases classified elsewhere: Secondary | ICD-10-CM

## 2024-03-18 DIAGNOSIS — K449 Diaphragmatic hernia without obstruction or gangrene: Secondary | ICD-10-CM

## 2024-03-18 DIAGNOSIS — K649 Unspecified hemorrhoids: Secondary | ICD-10-CM

## 2024-03-18 DIAGNOSIS — Z860100 Personal history of colon polyps, unspecified: Secondary | ICD-10-CM

## 2024-03-18 MED ORDER — PANTOPRAZOLE SODIUM 40 MG PO TBEC: 40.0000 mg | DELAYED_RELEASE_TABLET | Freq: Two times a day (BID) | ORAL | 3 refills | Status: AC

## 2024-03-18 NOTE — Progress Notes (Signed)
 03/18/2024 Lauren Andersen [id number] 1971-07-24  Referring provider: Lendia Boby CROME, NP-C  ASSESSMENT AND PLAN:   Postprandial right upper quadrant abdominal pain H pylori positive gastritis Hiatal hernia Patient's symptoms seem to worsen with ingestion of greasy foods. Recent RUQ U/S did not show any signs of gallstones. She does not describe any issues with her bowel habits. Denies constipation or diarrhea. Recent lab work was negative for pancreatitis or hepatitis. Her EGD did show H pylori infection but patient did not notice any significant difference in her ab pain after she completed her H pylori therapy. Will increase her PPI to BID to see if this helps with her symptoms and will also check to see if her H pylori infection is gone.  - Went over avoiding reflux inducing foods such as fatty foods, spicy foods, tomato-based foods, citrus, alcohol, and caffeine - Check Diatherix stool H pylori antigen  - Trial of pantoprazole  40 mg twice daily - Consider HIDA scan - RTC 2 months  Hepatic steatosis seen on CT abdomen pelvis with contrast 2023 with history of small liver hemangioma 09/17/2023 hepatitis C negative LFTs nml. FIB4 is 0.89, excluding advanced fibrosis -Normal liver test, consider hepatocellular work up if LFTs are elevated  Hemorrhoids Currently asymptomatic. Can use topical steroids or consider hemorrhoidal banding if symptoms were to worsen in the future  History of colon polyps - Next colonoscopy for polyp surveillance is due in 01/2029  Patient Care Team: Lendia Boby CROME, NP-C as PCP - General (Family Medicine) Theotis Haze ORN, NP (Nurse Practitioner) Cindie Jesusa HERO, RN as Registered Nurse Dannielle Arlean FALCON, RN (Inactive) as Registered Nurse  HISTORY OF PRESENT ILLNESS: 53 y.o. Spanish-speaking female with a past medical history listed below presents for follow up of RUQ ab pain  Interval History: Patient presents with her daughter Lauren Andersen  as well as a Spanish interpreter to clinic today. She is still feeling RUQ ab pain after she eats. This pain seems worsen particularly with eating greasy foods. She was only taking pantoprazole  at higher dosage when she was taking her H pylori treatment. She was able to fully complete her bismuth  quadruple therapy. She was familiar with the H pylori infection because her husband was also treated for H pylori. At times she will vomit but the vomitus will only contain grease. Denies diarrhea or constipation. She did develop hemorrhoids after having her daughter, which can flare up on occasion. Denies rectal bleeding, rectal pain, or rectal itching  RELEVANT GI HISTORY, IMAGING AND LABS:  RUQ U/S 12/03/23: IMPRESSION: 1. No acute abnormality identified. 2. Fatty infiltration of the liver.  EGD 01/04/24: - Normal esophagus. - Medium- sized hiatal hernia. - Gastritis. Biopsied. - Normal examined duodenum. Biopsied. Path: 1. Surgical [P], duodenal bulb and 2nd portion of duodenum :      -  DUODENAL MUCOSA WITH PROMINENT BRUNNER'S GLANDS AND FOCAL FOVEOLAR METAPLASIA      CONSISTENT WITH CHRONIC PEPTIC DUODENITIS.      2. Surgical [P], gastric antrum and gastric body :      -  ANTRAL AND OXYNTIC MUCOSA WITH MODERATE CHRONIC FOCAL MINIMALLY ACTIVE      HELICOBACTER ASSOCIATED GASTRITIS.      -  AN IMMUNOHISTOCHEMICAL STAIN FOR HELICOBACTER PYLORI ORGANISMS IS POSITIVE.  Colonoscopy 01/04/24: - The examined portion of the ileum was normal. - Two 3 to 5 mm polyps in the transverse colon and in the ascending colon, removed with a cold snare. Resected and retrieved. -  Non- bleeding internal hemorrhoids. Path:      3. Surgical [P], colon, ascending and transverse, polyp (2) :      - SESSILE SERRATED POLYP WITHOUT DYSPLASIA      - SEPARATE FRAGMENT OF COLONIC MUCOSA WITH A PROMINENT LYMPHOID AGGREGATE.   CBC    Component Value Date/Time   WBC 6.2 11/30/2023 1026   RBC 4.46 11/30/2023 1026   HGB 13.2  11/30/2023 1026   HGB 13.3 06/25/2020 1121   HCT 40.7 11/30/2023 1026   HCT 40.2 06/25/2020 1121   PLT 235.0 11/30/2023 1026   PLT 259 06/25/2020 1121   MCV 91.3 11/30/2023 1026   MCV 89 06/25/2020 1121   MCH 29.3 06/25/2020 1121   MCH 30.2 08/22/2014 1027   MCHC 32.3 11/30/2023 1026   RDW 14.3 11/30/2023 1026   RDW 12.6 06/25/2020 1121   LYMPHSABS 2.4 11/30/2023 1026   MONOABS 0.5 11/30/2023 1026   EOSABS 0.5 11/30/2023 1026   BASOSABS 0.1 11/30/2023 1026   Recent Labs    09/17/23 0937 11/30/23 1026  HGB 13.4 13.2    CMP     Component Value Date/Time   NA 141 11/30/2023 1026   NA 140 06/25/2020 1121   K 3.9 11/30/2023 1026   CL 107 11/30/2023 1026   CO2 27 11/30/2023 1026   GLUCOSE 114 (H) 11/30/2023 1026   BUN 12 11/30/2023 1026   BUN 13 06/25/2020 1121   CREATININE 0.64 11/30/2023 1026   CREATININE 0.69 08/22/2014 1027   CALCIUM 8.9 11/30/2023 1026   PROT 7.2 11/30/2023 1026   PROT 7.2 06/25/2020 1121   ALBUMIN 4.3 11/30/2023 1026   ALBUMIN 4.5 06/25/2020 1121   AST 17 11/30/2023 1026   ALT 18 11/30/2023 1026   ALKPHOS 36 (L) 11/30/2023 1026   BILITOT 0.5 11/30/2023 1026   BILITOT 0.3 06/25/2020 1121   GFRNONAA 107 06/25/2020 1121   GFRNONAA >89 08/22/2014 1027   GFRAA 124 06/25/2020 1121   GFRAA >89 08/22/2014 1027      Latest Ref Rng & Units 11/30/2023   10:26 AM 09/17/2023    9:37 AM 01/29/2022    3:56 PM  Hepatic Function  Total Protein 6.0 - 8.3 g/dL 7.2  7.8  7.7   Albumin 3.5 - 5.2 g/dL 4.3  4.4  4.3   AST 0 - 37 U/L 17  17  20    ALT 0 - 35 U/L 18  17  25    Alk Phosphatase 39 - 117 U/L 36  44  42   Total Bilirubin 0.2 - 1.2 mg/dL 0.5  0.6  0.4       Current Medications:        Current Outpatient Medications (Other):    Multiple Vitamin (MULTIVITAMIN) tablet, Take 1 tablet by mouth daily.   Nutritional Supp - Diet Aids (FAT BURNER THERAPY PO), Take by mouth.   pantoprazole  (PROTONIX ) 40 MG tablet, Take 1 tablet (40 mg total) by mouth  daily.  Medical History:  Past Medical History:  Diagnosis Date   Abdominal pain    Arthritis Dec 2023   Fatty liver 08/04/2010   GERD (gastroesophageal reflux disease)    Headache(784.0)    Hepatic steatosis    Allergies: No Known Allergies   Surgical History:  She  has a past surgical history that includes Cesarean section; Bladder surgery; Tubal ligation; bladder mesh (2008); Rectal prolapse repair (2008); and colon mesh. Family History:  Her family history includes Breast cancer in her cousin; Cancer  in her maternal aunt and maternal aunt; Colon polyps in her mother; Depression in her mother; Mental illness in her mother; Thyroid  disease in her mother.   PHYSICAL EXAM: BP 118/80   Pulse 63   Ht 5' 2 (1.575 m)   Wt 201 lb (91.2 kg)   BMI 36.76 kg/m  Physical Exam   General: Well appearing CV: Regular rate Resp: No increased WOB Abd: Mildly tender in the lower abdomen due to known fibroids. Non-tender anywhere else in the abdomen. Non-distended. Normal bowel sounds.  Rosario JAYSON Kidney, MD 9:23 AM  I spent 35 minutes of time, including in depth chart review, independent review of results as outlined above, communicating results with the patient directly, face-to-face time with the patient, coordinating care, and ordering studies and medications as appropriate, and documentation.

## 2024-03-18 NOTE — Patient Instructions (Addendum)
 Increase Pantoprazole  40 mg to twice a day.  Your provider has ordered Diatherix stool testing for you. You have received a kit from our office today containing all necessary supplies to complete this test. Please carefully read the stool collection instructions provided in the kit before opening the accompanying materials. In addition, be sure there is a label providing your full name and date of birth on the puritan opti-swab tube that is supplied in the kit (if you do not see a label with this information on your test tube, please make us  aware before test collection!). After completing the test, you should secure the purtian tube into the specimen biohazard bag. The [facility] E-Req sheet (including date and time of specimen collection) should be placed into the outside pocket of the specimen biohazard bag and returned to the [facility] lab (basement floor of [facility]) within 3 days of collection. Please make sure to give the specimen to a staff member at the lab. DO NOT leave the specimen on the counter.   If the specimen date and time (can be found in the upper right boxed portion of the sheet) are not filled out on the E-Req sheet, the test will NOT be performed.    Thank you for trusting me with your gastrointestinal care!    Y. Estefana Kidney, MD  _______________________________________________________  If your blood pressure at your visit was 140/90 or greater, please contact your primary care physician to follow up on this.  _______________________________________________________  If you are age 29 or older, your body mass index should be between 23-30. Your Body mass index is 36.76 kg/m. If this is out of the aforementioned range listed, please consider follow up with your Primary Care Provider.  If you are age 65 or younger, your body mass index should be between 19-25. Your Body mass index is 36.76 kg/m. If this is out of the aformentioned range  listed, please consider follow up with your Primary Care Provider.   ________________________________________________________  The [facility] providers would like to encourage you to use MYCHART to communicate with providers for non-urgent requests or questions.  Due to long hold times on the telephone, sending your provider a message by [facility] may be a faster and more efficient way to get a response.  Please allow 48 business hours for a response.  Please remember that this is for non-urgent requests.  _______________________________________________________  [facility] is using a team-based approach to care.  Your team is made up of your doctor and two to three APPS. Our APPS (Nurse Practitioners and Physician Assistants) work with your physician to ensure care continuity for you. They are fully qualified to address your health concerns and develop a treatment plan. They communicate directly with your gastroenterologist to care for you. Seeing the Advanced Practice Practitioners on your physician's team can help you by facilitating care more promptly, often allowing for earlier appointments, access to diagnostic testing, procedures, and other specialty referrals.

## 2024-03-29 ENCOUNTER — Encounter: Payer: Self-pay | Admitting: Internal Medicine

## 2024-03-29 ENCOUNTER — Telehealth: Payer: Self-pay

## 2024-03-29 NOTE — Telephone Encounter (Signed)
 Federico Rosario BROCKS, MD    03/29/24  1:30 PM Note    Received results of Diatherix H pylori stool antigen (collected on 03/24/24), which was negative. Will have this report scanned into the EMR.   Pod B triage, please let the patient know that her H pylori stool antigen was negative, suggesting that her infection is now eradicated.

## 2024-03-29 NOTE — Telephone Encounter (Signed)
 Pt made aware of recent results and Dr. Leonides Schanz recommendations: Pt verbalized understanding with all questions answered.

## 2024-03-29 NOTE — Progress Notes (Signed)
 Received results of Diatherix H pylori stool antigen (collected on 03/24/24), which was negative. Will have this report scanned into the EMR.  Pod B triage, please let the patient know that her H pylori stool antigen was negative, suggesting that her infection is now eradicated.

## 2024-04-12 ENCOUNTER — Encounter: Payer: Self-pay | Admitting: Internal Medicine

## 2024-04-21 ENCOUNTER — Ambulatory Visit: Admitting: Family Medicine

## 2024-05-16 NOTE — Progress Notes (Deleted)
 05/16/2024 [facility] [id number] 1970-09-06  Referring provider: Lendia Boby CROME, NP-C Primary GI doctor: Dr. Federico ( Dr. Aneita)  ASSESSMENT AND PLAN:   Postprandial right upper quadrant abdominal pain H. pylori positive gastritis Hiatal hernia 2023 AB US  normal GB CT abdomen pelvis with contrast 2023 unremarkable 12/03/2023 RUQ US  fatty liver unremarkable gallbladder EGD 01/04/2024 positive H. pylori treated with quadruple therapy bismuth  03/24/2024 H. pylori stool antigen negative Pantoprazole  40 mg twice daily started in August Consider HIDA  Hepatic steatosis seen on CT abdomen pelvis with contrast 2023 with history of small liver hemangioma 09/17/2023 hepatitis C negative Liver function unremarkable fib four 0.89 -Normal liver test, consider hepatocellular work up if LFTs are elevated - Monitor LFTs every 6 months with CBC - Consider elastography 2 to 3 years  Hemorrhoids Currently asymptomatic. Can use topical steroids or consider hemorrhoidal banding if symptoms were to worsen in the future  History of colon polyps - Next colonoscopy for polyp surveillance is due in 01/2029  Obesity  There is no height or weight on file to calculate BMI.  -Patient has been advised to make an attempt to improve diet and exercise patterns to aid in weight loss. -Recommended diet heavy in fruits and veggies and low in animal meats, cheeses, and dairy products, appropriate calorie intake   Patient Care Team: Lendia Boby CROME, NP-C as PCP - General (Family Medicine) Theotis Haze ORN, NP (Nurse Practitioner) Cindie Jesusa HERO, RN as Registered Nurse Dannielle Arlean FALCON, RN (Inactive) as Registered Nurse  HISTORY OF PRESENT ILLNESS: 53 y.o. Spanish-speaking female with a past medical history listed below presents for follow up of EGD/colon.  I last saw the patient in the past 11/30/2023 for right upper quadrant pain.  Had subsequent EGD colonoscopy with Dr. Federico 01/04/2024  and follow-up with her 03/18/2024 in the office  Discussed the use of AI scribe software for clinical note transcription with the patient, who gave verbal consent to proceed.  History of Present Illness            She  reports that she has never smoked. She has never used smokeless tobacco. She reports that she does not drink alcohol and does not use drugs.  RELEVANT GI HISTORY, IMAGING AND LABS: Results         RUQ U/S 12/03/23: IMPRESSION: 1. No acute abnormality identified. 2. Fatty infiltration of the liver.   EGD 01/04/24: - Normal esophagus. - Medium- sized hiatal hernia. - Gastritis. Biopsied. - Normal examined duodenum. Biopsied. Path: 1. Surgical [P], duodenal bulb and 2nd portion of duodenum :      -  DUODENAL MUCOSA WITH PROMINENT BRUNNER'S GLANDS AND FOCAL FOVEOLAR METAPLASIA      CONSISTENT WITH CHRONIC PEPTIC DUODENITIS.      2. Surgical [P], gastric antrum and gastric body :      -  ANTRAL AND OXYNTIC MUCOSA WITH MODERATE CHRONIC FOCAL MINIMALLY ACTIVE      HELICOBACTER ASSOCIATED GASTRITIS.      -  AN IMMUNOHISTOCHEMICAL STAIN FOR HELICOBACTER PYLORI ORGANISMS IS POSITIVE.   Colonoscopy 01/04/24: - The examined portion of the ileum was normal. - Two 3 to 5 mm polyps in the transverse colon and in the ascending colon, removed with a cold snare. Resected and retrieved. - Non- bleeding internal hemorrhoids. Path:      3. Surgical [P], colon, ascending and transverse, polyp (2) :      - SESSILE SERRATED POLYP WITHOUT DYSPLASIA      -  SEPARATE FRAGMENT OF COLONIC MUCOSA WITH A PROMINENT LYMPHOID AGGREGATE.  CBC    Component Value Date/Time   WBC 6.2 11/30/2023 1026   RBC 4.46 11/30/2023 1026   HGB 13.2 11/30/2023 1026   HGB 13.3 06/25/2020 1121   HCT 40.7 11/30/2023 1026   HCT 40.2 06/25/2020 1121   PLT 235.0 11/30/2023 1026   PLT 259 06/25/2020 1121   MCV 91.3 11/30/2023 1026   MCV 89 06/25/2020 1121   MCH 29.3 06/25/2020 1121   MCH 30.2 08/22/2014 1027    MCHC 32.3 11/30/2023 1026   RDW 14.3 11/30/2023 1026   RDW 12.6 06/25/2020 1121   LYMPHSABS 2.4 11/30/2023 1026   MONOABS 0.5 11/30/2023 1026   EOSABS 0.5 11/30/2023 1026   BASOSABS 0.1 11/30/2023 1026   Recent Labs    09/17/23 0937 11/30/23 1026  HGB 13.4 13.2    CMP     Component Value Date/Time   NA 141 11/30/2023 1026   NA 140 06/25/2020 1121   K 3.9 11/30/2023 1026   CL 107 11/30/2023 1026   CO2 27 11/30/2023 1026   GLUCOSE 114 (H) 11/30/2023 1026   BUN 12 11/30/2023 1026   BUN 13 06/25/2020 1121   CREATININE 0.64 11/30/2023 1026   CREATININE 0.69 08/22/2014 1027   CALCIUM 8.9 11/30/2023 1026   PROT 7.2 11/30/2023 1026   PROT 7.2 06/25/2020 1121   ALBUMIN 4.3 11/30/2023 1026   ALBUMIN 4.5 06/25/2020 1121   AST 17 11/30/2023 1026   ALT 18 11/30/2023 1026   ALKPHOS 36 (L) 11/30/2023 1026   BILITOT 0.5 11/30/2023 1026   BILITOT 0.3 06/25/2020 1121   GFRNONAA 107 06/25/2020 1121   GFRNONAA >89 08/22/2014 1027   GFRAA 124 06/25/2020 1121   GFRAA >89 08/22/2014 1027      Latest Ref Rng & Units 11/30/2023   10:26 AM 09/17/2023    9:37 AM 01/29/2022    3:56 PM  Hepatic Function  Total Protein 6.0 - 8.3 g/dL 7.2  7.8  7.7   Albumin 3.5 - 5.2 g/dL 4.3  4.4  4.3   AST 0 - 37 U/L 17  17  20    ALT 0 - 35 U/L 18  17  25    Alk Phosphatase 39 - 117 U/L 36  44  42   Total Bilirubin 0.2 - 1.2 mg/dL 0.5  0.6  0.4       Current Medications:        Current Outpatient Medications (Other):    Multiple Vitamin (MULTIVITAMIN) tablet, Take 1 tablet by mouth daily.   Nutritional Supp - Diet Aids (FAT BURNER THERAPY PO), Take by mouth.   pantoprazole  (PROTONIX ) 40 MG tablet, Take 1 tablet (40 mg total) by mouth 2 (two) times daily.  Medical History:  Past Medical History:  Diagnosis Date   Abdominal pain    Arthritis Dec 2023   Fatty liver 08/04/2010   GERD (gastroesophageal reflux disease)    Headache(784.0)    Hepatic steatosis    Allergies: No Known Allergies    Surgical History:  She  has a past surgical history that includes Cesarean section; Bladder surgery; Tubal ligation; bladder mesh (2008); Rectal prolapse repair (2008); and colon mesh. Family History:  Her family history includes Breast cancer in her cousin; Cancer in her maternal aunt and maternal aunt; Colon polyps in her mother; Depression in her mother; Mental illness in her mother; Thyroid  disease in her mother.  REVIEW OF SYSTEMS  : All other systems  reviewed and negative except where noted in the History of Present Illness.  PHYSICAL EXAM: There were no vitals taken for this visit. Physical Exam          Alan JONELLE Coombs, PA-C 2:09 PM

## 2024-05-18 ENCOUNTER — Ambulatory Visit: Admitting: Physician Assistant

## 2024-07-07 NOTE — Progress Notes (Unsigned)
 07/08/2024 Lauren Andersen [id number] 20-Apr-1971  Referring provider: Lendia Boby CROME, NP-C Primary GI doctor: Dr. Federico ( Dr. Aneita)  ASSESSMENT AND PLAN:   H. 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 pylori Diathe[employer] eradication study negative Symptoms improved, no GERD  RUQ  Was with fatty foods before surgery, this is bettery, now worse with movement - likely MSK, try salon pas patches - possible adhesions from surgery but likely too early, keep stools loose - consider HIDA  Hepatic steatosis seen on CT abdomen pelvis with contrast 2023 with history of small liver hemangioma 09/17/2023 hepatitis C negative 12/03/2023 RUQ US  fatty infiltration of the liver otherwise unremarkable    Latest Ref Rng & Units 11/30/2023   10:26 AM 09/17/2023    9:37 AM 01/29/2022    3:56 PM  Hepatic Function  Total Protein 6.0 - 8.3 g/dL 7.2  7.8  7.7   Albumin 3.5 - 5.2 g/dL 4.3  4.4  4.3   AST 0 - 37 U/L 17  17  20    ALT 0 - 35 U/L 18  17  25    Alk Phosphatase 39 - 117 U/L 36  44  42   Total Bilirubin 0.2 - 1.2 mg/dL 0.5  0.6  0.4    Platelets 235.0  -consider hepatocellular work up if LFTs are elevated  Personal history of polyp 01/04/2024 colonoscopy good bowel prep 2 SS polyps 3 to 5 mm transverse ascending colon normal TI nonbleeding internal hemorrhoids Recall 5 years  Morbid obesity  Body mass index is 37.49 kg/m.  -Patient has been advised to make an attempt to improve diet and exercise patterns to aid in weight loss. -Recommended diet heavy in fruits and veggies and low in animal meats, cheeses, and dairy products, appropriate  calorie intake   Patient Care Team: Lendia Boby CROME, NP-C as PCP - General (Family Medicine) Theotis Haze ORN, NP (Nurse Practitioner) Cindie Jesusa HERO, RN as Registered Nurse Dannielle Arlean FALCON, RN (Inactive) as Registered Nurse  HISTORY OF PRESENT ILLNESS: 53 y.o. Spanish-speaking female with a past medical history listed below presents for evaluation of GERD.   I last saw the patient in the office November 30 2023 for GERD. Saw Dr. Federico in August  Discussed the use of AI scribe software for clinical note transcription with the patient, who gave verbal consent to proceed.  History of Present Illness   Lauren Andersen is a 53 year old female with a history of hiatal hernia and fatty liver who presents with abdominal pain and reflux symptoms.  She experiences reflux symptoms primarily when consuming certain foods such as bananas, pineapple, or greasy foods. Her reflux symptoms have improved compared to the past, but she still experiences occasional heartburn. An endoscopy previously revealed a hiatal hernia and inflammation, and she was treated for H. pylori, which was negative on repeat testing.  She describes persistent right-sided abdominal pain and has a history of fatty liver. This pain has changed since her laparoscopic hysterectomy, now being more superficial and intermittent. The pain is different from the internal pain she experienced before the surgery, which was associated with greasy foods. The pain is sometimes exacerbated by constipation and physical activity.  She underwent a hysterectomy with laparoscopy, during which her appendix and part of her colon were affected. Post-surgery, she experienced pain that is different from her previous symptoms. She took ibuprofen  and Tylenol for a month post-surgery. Her diet, which includes a lot of fruits and vegetables, has improved her bowel movements and urination.  She reports that many of her previous symptoms improved after  treatment for H. pylori and her endoscopy and colonoscopy. Many of her previous symptoms improved after treatment for H. pylori.        She  reports that she has never smoked. She has never used smokeless tobacco. She reports that she does not drink alcohol and does not use drugs.  RELEVANT GI HISTORY, IMAGING AND LABS: Results   DIAGNOSTIC Endoscopy: Hiatal hernia, inflammation, Helicobacter pylori negative Colonoscopy: Not specified      CBC    Component Value Date/Time   WBC 6.2 11/30/2023 1026   RBC 4.46 11/30/2023 1026   HGB 13.2 11/30/2023 1026   HGB 13.3 06/25/2020 1121   HCT 40.7 11/30/2023 1026   HCT 40.2 06/25/2020 1121   PLT 235.0 11/30/2023 1026   PLT 259 06/25/2020 1121   MCV 91.3 11/30/2023 1026   MCV 89 06/25/2020 1121   MCH 29.3 06/25/2020 1121   MCH 30.2 08/22/2014 1027   MCHC 32.3 11/30/2023 1026   RDW 14.3 11/30/2023 1026   RDW 12.6 06/25/2020 1121   LYMPHSABS 2.4 11/30/2023 1026   MONOABS 0.5 11/30/2023 1026   EOSABS 0.5 11/30/2023 1026   BASOSABS 0.1 11/30/2023 1026   Recent Labs    09/17/23 0937 11/30/23 1026  HGB 13.4 13.2    CMP     Component Value Date/Time   NA 141 11/30/2023 1026   NA 140 06/25/2020 1121   K 3.9 11/30/2023 1026   CL 107 11/30/2023 1026   CO2 27 11/30/2023 1026   GLUCOSE 114 (H) 11/30/2023 1026   BUN 12 11/30/2023 1026   BUN 13 06/25/2020 1121   CREATININE 0.64 11/30/2023 1026   CREATININE 0.69 08/22/2014 1027   CALCIUM 8.9 11/30/2023 1026   PROT 7.2 11/30/2023 1026   PROT 7.2 06/25/2020 1121   ALBUMIN 4.3 11/30/2023 1026   ALBUMIN 4.5 06/25/2020 1121   AST 17 11/30/2023 1026   ALT 18 11/30/2023 1026   ALKPHOS 36 (L) 11/30/2023 1026   BILITOT 0.5 11/30/2023 1026   BILITOT 0.3 06/25/2020 1121   GFRNONAA 107 06/25/2020 1121   GFRNONAA >89 08/22/2014 1027   GFRAA 124 06/25/2020 1121   GFRAA >89 08/22/2014 1027      Latest Ref Rng & Units 11/30/2023   10:26 AM 09/17/2023    9:37 AM 01/29/2022    3:56 PM   Hepatic Function  Total Protein 6.0 - 8.3 g/dL 7.2  7.8  7.7   Albumin 3.5 - 5.2 g/dL 4.3  4.4  4.3   AST 0 - 37 U/L 17  17  20    ALT 0 - 35 U/L 18  17  25    Alk Phosphatase 39 - 117 U/L 36  44  42   Total Bilirubin 0.2 - 1.2 mg/dL 0.5  0.6  0.4       Current Medications:        Current Outpatient Medications (Other):    Nutritional Supp - Diet Aids (FAT BURNER THERAPY PO), Take by mouth.   pantoprazole  (PROTONIX ) 40 MG tablet, Take 1 tablet (40 mg total) by mouth 2 (two) times daily.   Multiple  Vitamin (MULTIVITAMIN) tablet, Take 1 tablet by mouth daily. (Patient not taking: Reported on 07/08/2024)  Medical History:  Past Medical History:  Diagnosis Date   Abdominal pain    Arthritis Dec 2023   Fatty liver 08/04/2010   GERD (gastroesophageal reflux disease)    Headache(784.0)    Hepatic steatosis    Allergies: No Known Allergies   Surgical History:  She  has a past surgical history that includes Cesarean section; Bladder surgery; Tubal ligation; bladder mesh (2008); Rectal prolapse repair (2008); and colon mesh. Family History:  Her family history includes Breast cancer in her cousin; Cancer in her maternal aunt and maternal aunt; Colon polyps in her mother; Depression in her mother; Mental illness in her mother; Thyroid  disease in her mother.  REVIEW OF SYSTEMS  : All other systems reviewed and negative except where noted in the History of Present Illness.  PHYSICAL EXAM: BP 118/86   Pulse 80   Ht 5' 2 (1.575 m)   Wt 205 lb (93 kg)   LMP 06/11/2020 (Approximate)   SpO2 98%   BMI 37.49 kg/m  Physical Exam   GENERAL APPEARANCE: Well nourished, in no apparent distress. HEENT: No cervical lymphadenopathy, unremarkable thyroid , sclerae anicteric, conjunctiva pink. RESPIRATORY: Respiratory effort normal, breath sounds equal bilaterally without rales, rhonchi, or wheezing. CARDIO: Regular rate and rhythm with no murmurs, rubs, or gallops, peripheral pulses  intact. ABDOMEN: Soft, non-distended, active bowel sounds in all four quadrants. Tenderness to palpation, pain on deep inspiration, and pain on muscle engagement. RECTAL: Declines. MUSCULOSKELETAL: Full range of motion, normal gait, without edema. SKIN: Dry, intact without rashes or lesions. No jaundice. NEURO: Alert, oriented, no focal deficits. PSYCH: Cooperative, normal mood and affect.      Alan JONELLE Coombs, PA-C 10:57 AM

## 2024-07-08 ENCOUNTER — Ambulatory Visit: Admitting: Physician Assistant

## 2024-07-08 ENCOUNTER — Encounter: Payer: Self-pay | Admitting: Physician Assistant

## 2024-07-08 VITALS — BP 118/86 | HR 80 | Ht 62.0 in | Wt 205.0 lb

## 2024-07-08 DIAGNOSIS — Z860101 Personal history of adenomatous and serrated colon polyps: Secondary | ICD-10-CM

## 2024-07-08 DIAGNOSIS — K760 Fatty (change of) liver, not elsewhere classified: Secondary | ICD-10-CM

## 2024-07-08 DIAGNOSIS — K219 Gastro-esophageal reflux disease without esophagitis: Secondary | ICD-10-CM

## 2024-07-08 DIAGNOSIS — Z6837 Body mass index (BMI) 37.0-37.9, adult: Secondary | ICD-10-CM

## 2024-07-08 DIAGNOSIS — K449 Diaphragmatic hernia without obstruction or gangrene: Secondary | ICD-10-CM

## 2024-07-08 DIAGNOSIS — A048 Other specified bacterial intestinal infections: Secondary | ICD-10-CM

## 2024-07-08 DIAGNOSIS — R1011 Right upper quadrant pain: Secondary | ICD-10-CM

## 2024-07-08 DIAGNOSIS — Z860100 Personal history of colon polyps, unspecified: Secondary | ICD-10-CM

## 2024-07-08 NOTE — Patient Instructions (Addendum)
   Trata salon pas patch   Miralax es un laxante osmtico.  Slo aporta ms agua a las heces.  Es seguro tomarlo a diario.  Puede tomar hasta 17 gramos de smurfit-stone container al da.  Mezclar con jugo o caf.  Comience con 1 tapn por la noche durante 3-4 das y reevale su respuesta en 3-4 das.  Puede aumentar y disminuir la dosis segn su respuesta.  Recuerde, pueden pasar entre 3 y 4 das hasta que surta efecto O hasta que los efectos desaparezcan.   A menudo lo combino con [employer] por la maana para ayudar a asegurar que las heces no estn demasiado sueltas.
